# Patient Record
Sex: Female | Born: 1960 | Race: White | Hispanic: No | Marital: Married | Smoking: Never smoker
Health system: Southern US, Community
[De-identification: ages and names within clinical notes are randomized; demographics above are authoritative.]

## PROBLEM LIST (undated history)

## (undated) DIAGNOSIS — B0089 Other herpesviral infection: Secondary | ICD-10-CM

## (undated) DIAGNOSIS — R209 Unspecified disturbances of skin sensation: Secondary | ICD-10-CM

## (undated) DIAGNOSIS — F329 Major depressive disorder, single episode, unspecified: Secondary | ICD-10-CM

## (undated) DIAGNOSIS — R51 Headache: Secondary | ICD-10-CM

## (undated) DIAGNOSIS — B009 Herpesviral infection, unspecified: Secondary | ICD-10-CM

## (undated) DIAGNOSIS — M359 Systemic involvement of connective tissue, unspecified: Secondary | ICD-10-CM

## (undated) DIAGNOSIS — K589 Irritable bowel syndrome without diarrhea: Secondary | ICD-10-CM

## (undated) DIAGNOSIS — C439 Malignant melanoma of skin, unspecified: Secondary | ICD-10-CM

## (undated) DIAGNOSIS — M8580 Other specified disorders of bone density and structure, unspecified site: Secondary | ICD-10-CM

## (undated) DIAGNOSIS — F909 Attention-deficit hyperactivity disorder, unspecified type: Secondary | ICD-10-CM

## (undated) HISTORY — DX: Irritable bowel syndrome, unspecified: K58.9

## (undated) HISTORY — DX: Malignant melanoma of skin, unspecified: C43.9

## (undated) HISTORY — DX: Other herpesviral infection: B00.89

## (undated) HISTORY — PX: CHOLECYSTECTOMY: SHX55

## (undated) HISTORY — DX: Other specified disorders of bone density and structure, unspecified site: M85.80

## (undated) HISTORY — DX: Systemic involvement of connective tissue, unspecified: M35.9

## (undated) HISTORY — DX: Unspecified disturbances of skin sensation: R20.9

## (undated) HISTORY — DX: Attention-deficit hyperactivity disorder, unspecified type: F90.9

## (undated) HISTORY — DX: Herpesviral infection, unspecified: B00.9

## (undated) HISTORY — DX: Major depressive disorder, single episode, unspecified: F32.9

## (undated) HISTORY — DX: Headache: R51

---

## 2000-06-17 ENCOUNTER — Other Ambulatory Visit: Admission: RE | Admit: 2000-06-17 | Discharge: 2000-06-17 | Payer: Self-pay | Admitting: Family Medicine

## 2003-07-06 ENCOUNTER — Other Ambulatory Visit: Admission: RE | Admit: 2003-07-06 | Discharge: 2003-07-06 | Payer: Self-pay | Admitting: Family Medicine

## 2004-10-10 ENCOUNTER — Other Ambulatory Visit: Admission: RE | Admit: 2004-10-10 | Discharge: 2004-10-10 | Payer: Self-pay | Admitting: Family Medicine

## 2005-10-16 ENCOUNTER — Other Ambulatory Visit: Admission: RE | Admit: 2005-10-16 | Discharge: 2005-10-16 | Payer: Self-pay | Admitting: Family Medicine

## 2006-11-26 ENCOUNTER — Other Ambulatory Visit: Admission: RE | Admit: 2006-11-26 | Discharge: 2006-11-26 | Payer: Self-pay | Admitting: Family Medicine

## 2008-04-21 ENCOUNTER — Other Ambulatory Visit: Admission: RE | Admit: 2008-04-21 | Discharge: 2008-04-21 | Payer: Self-pay | Admitting: Family Medicine

## 2008-06-19 ENCOUNTER — Encounter: Payer: Self-pay | Admitting: Gastroenterology

## 2008-12-28 ENCOUNTER — Encounter: Payer: Self-pay | Admitting: Gastroenterology

## 2009-04-23 ENCOUNTER — Encounter: Payer: Self-pay | Admitting: Gastroenterology

## 2009-04-23 ENCOUNTER — Other Ambulatory Visit: Admission: RE | Admit: 2009-04-23 | Discharge: 2009-04-23 | Payer: Self-pay | Admitting: Family Medicine

## 2009-05-24 ENCOUNTER — Encounter: Admission: RE | Admit: 2009-05-24 | Discharge: 2009-05-24 | Payer: Self-pay | Admitting: Family Medicine

## 2009-06-19 ENCOUNTER — Encounter (INDEPENDENT_AMBULATORY_CARE_PROVIDER_SITE_OTHER): Payer: Self-pay | Admitting: *Deleted

## 2009-06-19 ENCOUNTER — Ambulatory Visit: Payer: Self-pay | Admitting: Gastroenterology

## 2009-06-19 DIAGNOSIS — K219 Gastro-esophageal reflux disease without esophagitis: Secondary | ICD-10-CM | POA: Insufficient documentation

## 2009-06-19 DIAGNOSIS — M1990 Unspecified osteoarthritis, unspecified site: Secondary | ICD-10-CM | POA: Insufficient documentation

## 2009-06-19 DIAGNOSIS — R197 Diarrhea, unspecified: Secondary | ICD-10-CM | POA: Insufficient documentation

## 2009-06-19 LAB — CONVERTED CEMR LAB: Tissue Transglutaminase Ab, IgA: 2.6 units (ref <20)

## 2009-06-21 ENCOUNTER — Encounter: Payer: Self-pay | Admitting: Gastroenterology

## 2009-06-21 DIAGNOSIS — E538 Deficiency of other specified B group vitamins: Secondary | ICD-10-CM | POA: Insufficient documentation

## 2009-06-21 LAB — CONVERTED CEMR LAB
Amylase: 56 units/L (ref 27–131)
Folate: 8.9 ng/mL
IgA: 252 mg/dL (ref 68–378)
Magnesium: 1.9 mg/dL (ref 1.5–2.5)
Sed Rate: 15 mm/hr (ref 0–22)
Transferrin: 255.9 mg/dL (ref 212.0–360.0)
Vitamin B-12: 252 pg/mL (ref 211–911)

## 2009-06-26 ENCOUNTER — Ambulatory Visit (HOSPITAL_COMMUNITY): Admission: RE | Admit: 2009-06-26 | Discharge: 2009-06-26 | Payer: Self-pay | Admitting: Gastroenterology

## 2009-06-27 ENCOUNTER — Telehealth: Payer: Self-pay | Admitting: Gastroenterology

## 2009-06-27 ENCOUNTER — Ambulatory Visit: Payer: Self-pay | Admitting: Gastroenterology

## 2009-06-28 DIAGNOSIS — K8020 Calculus of gallbladder without cholecystitis without obstruction: Secondary | ICD-10-CM | POA: Insufficient documentation

## 2009-07-02 ENCOUNTER — Encounter: Payer: Self-pay | Admitting: Gastroenterology

## 2009-08-07 ENCOUNTER — Ambulatory Visit (HOSPITAL_COMMUNITY): Admission: RE | Admit: 2009-08-07 | Discharge: 2009-08-07 | Payer: Self-pay | Admitting: General Surgery

## 2010-03-14 NOTE — Procedures (Signed)
Summary: Colonoscopy  Patient: Shelly Morrison Note: All result statuses are Final unless otherwise noted.  Tests: (1) Colonoscopy (COL)   COL Colonoscopy           DONE     [facility]     [address]     [place], [place]  [address]           COLONOSCOPY PROCEDURE REPORT           PATIENT:  [name], [name]  MR#:  [id number]     BIRTHDATE:  [birth year], 48 yrs. old  GENDER:  female     ENDOSCOPIST:  Vania Rea. Jarold Motto, MD, [facility]     REF. BY:     PROCEDURE DATE:  06/27/2009     PROCEDURE:  Colonoscopy with biopsy and snare polypectomy     ASA CLASS:  Class II     INDICATIONS:  colorectal cancer screening, average risk     MEDICATIONS:   Fentanyl 75 mcg IV, Versed 7 mg IV           DESCRIPTION OF PROCEDURE:   After the risks benefits and     alternatives of the procedure were thoroughly explained, informed     consent was obtained.  Digital rectal exam was performed and     revealed no abnormalities.   The LB [id number] endoscope     was introduced through the anus and advanced to the cecum, which     was identified by both the appendix and ileocecal valve, without     limitations.  The quality of the prep was excellent, using     MoviPrep.  The instrument was then slowly withdrawn as the colon     was fully examined.     <<PROCEDUREIMAGES>>           FINDINGS:  Mild diverticulosis was found in the sigmoid to     descending colon segments. RANDOM LEFT COLON BIOPSIES DONE.  A     sessile polyp was found. 1 CM FLAT POLYP HOT SNARE EXCISED FROM     HEPATIC FLEXURE.   Retroflexed views in the rectum revealed no     abnormalities.    The scope was then withdrawn from the patient     and the procedure completed.           COMPLICATIONS:  None     ENDOSCOPIC IMPRESSION:     1) Mild diverticulosis in the sigmoid to descending colon     segments     2) Sessile polyp     3) Normal colonoscopy otherwise     RECOMMENDATIONS:     1) high fiber diet     2) Repeat Colonoscopy  in 5 years.     3) Await biopsy results     SURGICAL REFERRAL PER SYMPOMATIC GALLSTONES.     REPEAT EXAM:  No           ______________________________     Vania Rea. Jarold Motto, MD, [facility]           CC:           n.     eSIGNED:   Vania Rea. Patterson at 06/27/2009 11:40 AM           Stephan Minister, [id number]  Note: An exclamation mark (!) indicates a result that was not dispersed into the flowsheet. Document Creation Date: 06/28/2009 5:07 PM _______________________________________________________________________  (1) Order result status: Final Collection or observation date-time: 06/27/2009 11:35 Requested  date-time:  Receipt date-time:  Reported date-time:  Referring Physician:   Ordering Physician: Sheryn Bison [phone]) Specimen Source:  Source: Launa Grill Order Number: [id number] Lab site:   Appended Document: Colonoscopy     Procedures Next Due Date:    Colonoscopy: 06/2019

## 2010-03-14 NOTE — Letter (Signed)
Summary: Deboraha Sprang at [facility] at [facility]   Imported By: Lester Aiken 07/13/2009 09:13:57  _____________________________________________________________________  External Attachment:    Type:   Image     Comment:   External Document

## 2010-03-14 NOTE — Letter (Signed)
Summary: [facility] Instructions  [facility]  [address] [place], [place] [address]   Phone: [phone]  Fax: [phone]       Jaida Alonzo    [birth year]    MRN: [id number]        Procedure Day Dorna Bloom: Wednesday, 06/27/09     Arrival Time: 10:00      Procedure Time: 11:00     Location of Procedure:                    _X _  [facility] (4th Floor)                         PREPARATION FOR COLONOSCOPY WITH MOVIPREP   Starting 5 days prior to your procedure 06/22/09 do not eat nuts, seeds, popcorn, corn, beans, peas,  salads, or any raw vegetables.  Do not take any fiber supplements (e.g. Metamucil, Citrucel, and Benefiber).  THE DAY BEFORE YOUR PROCEDURE         DATE: 06/26/09   DAY: Tuesday  1.  Drink clear liquids the entire day-NO SOLID FOOD  2.  Do not drink anything colored red or purple.  Avoid juices with pulp.  No orange juice.  3.  Drink at least 64 oz. (8 glasses) of fluid/clear liquids during the day to prevent dehydration and help the prep work efficiently.  CLEAR LIQUIDS INCLUDE: Water Jello Ice Popsicles Tea (sugar ok, no milk/cream) Powdered fruit flavored drinks Coffee (sugar ok, no milk/cream) Gatorade Juice: apple, white grape, white cranberry  Lemonade Clear bullion, consomm, broth Carbonated beverages (any kind) Strained chicken noodle soup Hard Candy                             4.  In the morning, mix first dose of MoviPrep solution:    Empty 1 Pouch A and 1 Pouch B into the disposable container    Add lukewarm drinking water to the top line of the container. Mix to dissolve    Refrigerate (mixed solution should be used within 24 hrs)  5.  Begin drinking the prep at 5:00 p.m. The MoviPrep container is divided by 4 marks.   Every 15 minutes drink the solution down to the next mark (approximately 8 oz) until the full liter is complete.   6.  Follow completed prep with 16 oz of clear liquid of your choice (Nothing  red or purple).  Continue to drink clear liquids until bedtime.  7.  Before going to bed, mix second dose of MoviPrep solution:    Empty 1 Pouch A and 1 Pouch B into the disposable container    Add lukewarm drinking water to the top line of the container. Mix to dissolve    Refrigerate  THE DAY OF YOUR PROCEDURE      DATE: 06/27/09  DAY: Wednesday  Beginning at 6:00 a.m. (5 hours before procedure):         1. Every 15 minutes, drink the solution down to the next mark (approx 8 oz) until the full liter is complete.  2. Follow completed prep with 16 oz. of clear liquid of your choice.    3. You may drink clear liquids until 9:00 (2 HOURS BEFORE PROCEDURE).   MEDICATION INSTRUCTIONS  Unless otherwise instructed, you should take regular prescription medications with a small sip of water   as early as possible the morning  of your procedure.                  OTHER INSTRUCTIONS  You will need a responsible adult at least 48 years of age to accompany you and drive you home.   This person must remain in the waiting room during your procedure.  Wear loose fitting clothing that is easily removed.  Leave jewelry and other valuables at home.  However, you may wish to bring a book to read or  an iPod/MP3 player to listen to music as you wait for your procedure to start.  Remove all body piercing jewelry and leave at home.  Total time from sign-in until discharge is approximately 2-3 hours.  You should go home directly after your procedure and rest.  You can resume normal activities the  day after your procedure.  The day of your procedure you should not:   Drive   Make legal decisions   Operate machinery   Drink alcohol   Return to work  You will receive specific instructions about eating, activities and medications before you leave.    The above instructions have been reviewed and explained to me by   _______________________    I fully understand and can  verbalize these instructions _____________________________ Date _________

## 2010-03-14 NOTE — Letter (Signed)
Summary: Patient Notice- Polyp Results  [facility]  [address] [place], [place] [address]   Phone: [phone]  Fax: [phone]        Jul 02, 2009 MRN: [id number]    [facility] [address] [place], [place]  [address]    Dear Ms. Tacker,  I am pleased to inform you that the colon polyp(s) removed during your recent colonoscopy was (were) found to be benign (no cancer detected) upon pathologic examination.  I recommend you have a repeat colonoscopy examination in 10_ years to look for recurrent polyps, as having colon polyps increases your risk for having recurrent polyps or even colon cancer in the future.  Should you develop new or worsening symptoms of abdominal pain, bowel habit changes or bleeding from the rectum or bowels, please schedule an evaluation with either your primary care physician or with me.  Additional information/recommendations:  __ No further action with gastroenterology is needed at this time. Please      follow-up with your primary care physician for your other healthcare      needs.  __ Please call [phone] to schedule a return visit to review your      situation.  __ Please keep your follow-up visit as already scheduled.  _XX_ Continue treatment plan as outlined the day of your exam.  Please call us if you are having persistent problems or have questions about your condition that have not been fully answered at this time.  Sincerely,  Mardella Layman MD [facility]  This letter has been electronically signed by your physician.  Appended Document: Patient Notice- Polyp Results letter mailed

## 2010-03-14 NOTE — Assessment & Plan Note (Signed)
Summary: change in bms...em   History of Present Illness Visit Type: Initial Visit Primary GI MD: Sheryn Bison MD FACP FAGA Primary Provider: Corky Mull, PA Chief Complaint: A recent change in bowels with mucus in stool and lighter stools. Pt also has a increase in indigestion.  History of Present Illness:   Shelly Morrison 50 year old Caucasian female self referred for" change in bowel habits". She has gone from pellet-shaped stools to foul-smelling oily and greasy stools with some  oil droplets noted. She has had no anorexia, weight loss, abdominal pain, or melena. She denies any changes in her diet, and does not have lactose intolerance, use sorbitol or fructose dose, or had any new medications.She has not had prior GI evaluation or x-rays. She also says several episodes of severe indigestion and substernal chest pain alleviated by taking Nexium. She denies daily reflux symptoms or dysphagia. She denies any specific hepatobiliary complaints. She does take p.r.n. dalteparin and Aleve for degenerative arthritis. There are no associated skin rashes, mouth sores, or other systemic complaints. She has no history of hepatitis or pancreatitis, but does have several relatives with pancreatic cancer. She does not abuse alcohol or cigarettes.    GI Review of Systems    Reports belching and  bloating.      Denies abdominal pain, acid reflux, chest pain, dysphagia with liquids, dysphagia with solids, heartburn, loss of appetite, nausea, vomiting, vomiting blood, weight loss, and  weight gain.      Reports change in bowel habits.     Denies anal fissure, black tarry stools, constipation, diarrhea, diverticulosis, fecal incontinence, heme positive stool, hemorrhoids, irritable bowel syndrome, jaundice, light color stool, liver problems, rectal bleeding, and  rectal pain. Preventive Screening-Counseling & Management  Alcohol-Tobacco     Smoking Status: never      Drug Use:  no.      Current Medications  (verified): 1)  Antihistamine Decongestant 2.5-60 Mg Tabs (Triprolidine-Pseudoephedrine) .... As Needed 2)  Valtrex 500 Mg Tabs (Valacyclovir Hcl) .... One Tablet By Mouth Once Daily 3)  Voltaren-Xr 100 Mg Xr24h-Tab (Diclofenac Sodium) .... As Needed 4)  Vitamin D 2000 Unit Tabs (Cholecalciferol) .... One Capsule By Mouth Once Daily 5)  Vitamin C 500 Mg Tabs (Ascorbic Acid) .... One Tablet By Mouth Once Daily 6)  Aleve 220 Mg Tabs (Naproxen Sodium) .... As Needed 7)  Excedrin Migraine 250-250-65 Mg Tabs (Aspirin-Acetaminophen-Caffeine) .... As Needed  Allergies (verified): 1)  ! Sulfa 2)  ! Erythromycin  Past History:  Past medical, surgical, family and social histories (including risk factors) reviewed for relevance to current acute and chronic problems.  Past Medical History: Depression  Past Surgical History: C-section  Family History: Reviewed history and no changes required. Family History of Pancreatic Cancer:Grandmother Family History of Diabetes: Uncle Mother: Melanoma Father: Bladder cancer Family History of Heart Disease: Grandfather  Social History: Reviewed history and no changes required. Married Patient has never smoked.  Alcohol Use - yes 2 glasses once daily  Daily Caffeine Use Illicit Drug Use - no Smoking Status:  never Drug Use:  no  Review of Systems       The patient complains of arthritis/joint pain.  The patient denies allergy/sinus, anemia, anxiety-new, back pain, blood in urine, breast changes/lumps, change in vision, confusion, cough, coughing up blood, depression-new, fainting, fatigue, fever, headaches-new, hearing problems, heart murmur, heart rhythm changes, itching, menstrual pain, muscle pains/cramps, night sweats, nosebleeds, pregnancy symptoms, shortness of breath, skin rash, sleeping problems, sore throat, swelling of feet/legs, swollen lymph  glands, thirst - excessive , urination - excessive , urination changes/pain, urine leakage, vision  changes, and voice change.    Vital Signs:  Patient profile:   50 year old female Height:      63 inches Weight:      171.38 pounds BMI:     30.47 Pulse rate:   80 / minute Pulse rhythm:   regular BP sitting:   106 / 68  (right arm) Cuff size:   regular  Vitals Entered By: Christie Nottingham CMA Duncan Dull) (Jun 19, 2009 8:44 AM)  Physical Exam  General:  Well developed, well nourished, no acute distress.healthy appearing.   Head:  Normocephalic and atraumatic. Eyes:  PERRLA, no icterus.exam deferred to patient's ophthalmologist.   Neck:  Supple; no masses or thyromegaly. Lungs:  Clear throughout to auscultation. Heart:  Regular rate and rhythm; no murmurs, rubs,  or bruits. Abdomen:  Soft, nontender and nondistended. No masses, hepatosplenomegaly or hernias noted. Normal bowel sounds. Rectal:  Normal exam.hemocult negative.   Msk:  Symmetrical with no gross deformities. Normal posture. Pulses:  Normal pulses noted. Extremities:  No clubbing, cyanosis, edema or deformities noted. Neurologic:  Alert and  oriented x4;  grossly normal neurologically. Cervical Nodes:  No significant cervical adenopathy. Psych:  Alert and cooperative. Normal mood and affect.   Impression & Recommendations:  Problem # 1:  DIARRHEA (ICD-787.91) Assessment Unchanged Consider malabsorption syndrome and associated steatorrhea. I've ordered stool for fat exam, metabolic malabsorption parameters, upper abdominal ultrasound exam, amylase, lipase, and also colonoscopy exam for screening purposes. Have asked her to avoid any products with sorbitol or fructose. She may need a more extensive malabsorption workup depending on her initial evaluation. I have asked her to sign for recent labs from primary care at [facility].Have instituted a trial of probiotic therapy with daily Align. Orders: TLB-B12, Serum-Total ONLY (21308-M57) TLB-Ferritin (82728-FER) TLB-Folic Acid (Folate) (82746-FOL) TLB-IBC Pnl  (Iron/FE;Transferrin) (83550-IBC) TLB-Amylase (82150-AMYL) TLB-Lipase (83690-LIPASE) TLB-Magnesium (Mg) (83735-MG) TLB-Sedimentation Rate (ESR) (85652-ESR) TLB-TSH (Thyroid Stimulating Hormone) (84443-TSH) T-Beta Carotene (84696-29528) TLB-IgA (Immunoglobulin A) (82784-IGA) T-Sprue Panel (Celiac Disease Aby Eval) (83516x3/[address])  Problem # 2:  ESOPHAGEAL REFLUX (ICD-530.81) Assessment: Unchanged upper abdominal ultrasound exam ordered. She is to use p.r.n. Nexium and p.r.n. sublingual Levsin. Orders: TLB-B12, Serum-Total ONLY (41324-M01) TLB-Ferritin (82728-FER) TLB-Folic Acid (Folate) (82746-FOL) TLB-IBC Pnl (Iron/FE;Transferrin) (83550-IBC) TLB-Amylase (82150-AMYL) TLB-Lipase (83690-LIPASE) TLB-Magnesium (Mg) (83735-MG) TLB-Sedimentation Rate (ESR) (85652-ESR) TLB-TSH (Thyroid Stimulating Hormone) (84443-TSH) T-Beta Carotene (02725-36644) TLB-IgA (Immunoglobulin A) (82784-IGA) T-Sprue Panel (Celiac Disease Aby Eval) (83516x3/[address])  Problem # 3:  NEOPLASM, MALIGNANT, PANCREAS, FAMILY HX (ICD-V16.0) Assessment: Unchanged amylase, lipase, and ultrasound exam ordered. If there is evidence of malabsorption, for the pancreatic evaluation may be indicated. This would include stool exam for fecal elastase-1.  Problem # 4:  DEGENERATIVE JOINT DISEASE (ICD-715.90) Assessment: Unchanged Some of her GI complaints may be related to use of several different NSAIDs which I have asked her to stop. There is no evidence of inflammatory arthritis on physical exam.  Patient Instructions: 1)  Please go to teh basement for blood work. 2)  Multimedia programmer daily. 3)  Begin Levsin as needed for abd spasms 4)  You are scheduled for a colonoscopy. 5)  You are scheduled for an ultrasound. 6)  The medication list was reviewed and reconciled.  All changed / newly prescribed medications were explained.  A complete medication list was provided to the patient / caregiver. 7)  Please continue  current medications.  8)  Copy sent to :  Corky Mull, physician assistant at [facility]. Labs have been requested for review.  Appended Document: change in bms...em    Clinical Lists Changes  Medications: Added new medication of MOVIPREP 100 GM  SOLR (PEG-KCL-NACL-NASULF-NA ASC-C) As per prep instructions. - Signed Added new medication of LEVSIN/SL 0.125 MG  SUBL (HYOSCYAMINE SULFATE) 1 SL q 4-6 hrs as needed colon spasms - Signed Added new medication of ALIGN   CAPS (MISC INTESTINAL FLORA REGULAT) Take one capsule by mouth daily Rx of MOVIPREP 100 GM  SOLR (PEG-KCL-NACL-NASULF-NA ASC-C) As per prep instructions.;  #1 x 0;  Signed;  Entered by: Ashok Cordia RN;  Authorized by: Mardella Layman MD [facility];  Method used: Electronically to [facility] [address] [place], [place]  [id number], Ph: [phone], Fax: [phone] Rx of LEVSIN/SL 0.125 MG  SUBL (HYOSCYAMINE SULFATE) 1 SL q 4-6 hrs as needed colon spasms;  #40 x 3;  Signed;  Entered by: Ashok Cordia RN;  Authorized by: Mardella Layman MD [facility];  Method used: Electronically to [facility] [address], [place], [place]  [id number], Ph: [phone], Fax: [phone] Orders: Added new Test order of Colonoscopy (Colon) - Signed Added new Test order of Ultrasound Abdomen (UAS) - Signed    Prescriptions: LEVSIN/SL 0.125 MG  SUBL (HYOSCYAMINE SULFATE) 1 SL q 4-6 hrs as needed colon spasms  #40 x 3   Entered by:   Ashok Cordia RN   Authorized by:   Mardella Layman MD [facility]   Signed by:   Ashok Cordia RN on 06/19/2009   Method used:   Electronically to        [facility]* (retail)       [address]       [place], [place]  [address]       Ph: [phone]       Fax: [phone]   RxID:   [id number] MOVIPREP 100 GM  SOLR (PEG-KCL-NACL-NASULF-NA ASC-C) As per prep instructions.  #1 x 0   Entered by:   Ashok Cordia RN   Authorized by:   Mardella Layman MD [facility]    Signed by:   Ashok Cordia RN on 06/19/2009   Method used:   Electronically to        [facility]* (retail)       [address]       [place], [place]  [address]       Ph: [phone]       Fax: [phone]   RxID:   [id number]

## 2010-03-14 NOTE — Letter (Signed)
Summary: Deboraha Sprang at [facility] at [facility]   Imported By: Lester Addison 07/13/2009 09:23:38  _____________________________________________________________________  External Attachment:    Type:   Image     Comment:   External Document

## 2010-03-14 NOTE — Progress Notes (Signed)
Summary: Surgical referral  Phone Note Outgoing Call   Summary of Call: Per Dr. Jarold Motto.  Pt needs surgical referral re Gallstones. Initial call taken by: Ashok Cordia RN,  Jun 27, 2009 12:32 PM  Follow-up for Phone Call        records faxed to [facility] at [facility] for new pt referral. Follow-up by: Ashok Cordia RN,  Jun 27, 2009 4:56 PM  Additional Follow-up for Phone Call Additional follow up Details #1::        LM for [facility] to call.  Lupita Leash Surface RN  Jun 28, 2009 12:58 PM   Appt sch for pt to see Dr. Zachery Dakins on June 1 at 1:45.  Elane Fritz will contact pt. Additional Follow-up by: Ashok Cordia RN,  Jun 28, 2009 1:22 PM  New Problems: CHOLELITHIASIS (ICD-574.20)   New Problems: CHOLELITHIASIS (ICD-574.20)

## 2010-04-28 LAB — DIFFERENTIAL
Eosinophils Absolute: 0.1 10*3/uL (ref 0.0–0.7)
Lymphs Abs: 1.4 10*3/uL (ref 0.7–4.0)
Monocytes Absolute: 0.4 10*3/uL (ref 0.1–1.0)
Monocytes Relative: 9 % (ref 3–12)
Neutro Abs: 2.5 10*3/uL (ref 1.7–7.7)
Neutrophils Relative %: 57 % (ref 43–77)

## 2010-04-28 LAB — COMPREHENSIVE METABOLIC PANEL
CO2: 29 mEq/L (ref 19–32)
Calcium: 9.4 mg/dL (ref 8.4–10.5)
Chloride: 104 mEq/L (ref 96–112)
GFR calc Af Amer: >60 mL/min (ref >60)
GFR calc non Af Amer: >60 mL/min (ref >60)
Sodium: 139 mEq/L (ref 135–145)
Total Bilirubin: 0.4 mg/dL (ref 0.3–1.2)

## 2010-04-28 LAB — CBC
HCT: 41.3 % (ref 36.0–46.0)
MCH: 32 pg (ref 26.0–34.0)
MCV: 97 fL (ref 78.0–100.0)
Platelets: 222 10*3/uL (ref 150–400)
RBC: 4.26 MIL/uL (ref 3.87–5.11)
RDW: 12.5 % (ref 11.5–15.5)
WBC: 4.3 10*3/uL (ref 4.0–10.5)

## 2010-05-08 ENCOUNTER — Other Ambulatory Visit: Payer: Self-pay | Admitting: Family Medicine

## 2010-05-08 DIAGNOSIS — Z1231 Encounter for screening mammogram for malignant neoplasm of breast: Secondary | ICD-10-CM

## 2010-05-13 ENCOUNTER — Other Ambulatory Visit (HOSPITAL_COMMUNITY)
Admission: RE | Admit: 2010-05-13 | Discharge: 2010-05-13 | Disposition: A | Discharge status: Home / Self Care | Payer: BC Managed Care – PPO | Source: Ambulatory Visit | Attending: Family Medicine | Admitting: Family Medicine

## 2010-05-13 ENCOUNTER — Other Ambulatory Visit: Payer: Self-pay | Admitting: Physician Assistant

## 2010-05-13 DIAGNOSIS — Z124 Encounter for screening for malignant neoplasm of cervix: Secondary | ICD-10-CM | POA: Insufficient documentation

## 2010-05-27 ENCOUNTER — Ambulatory Visit
Admission: RE | Admit: 2010-05-27 | Discharge: 2010-05-27 | Disposition: A | Discharge status: Home / Self Care | Payer: BC Managed Care – PPO | Source: Ambulatory Visit | Attending: Family Medicine | Admitting: Family Medicine

## 2010-05-27 DIAGNOSIS — Z1231 Encounter for screening mammogram for malignant neoplasm of breast: Secondary | ICD-10-CM

## 2010-09-28 IMAGING — US US ABDOMEN COMPLETE
1 series · 14 of 25 positions shown · non-contrast
Comparison: None.

CLINICAL DATA: Abdominal pain and reflux

ABDOMINAL ULTRASOUND COMPLETE

[Series 1: us abdomen complete · 0.28mm/px · 14 of 100 slices shown]
[im 1/100]
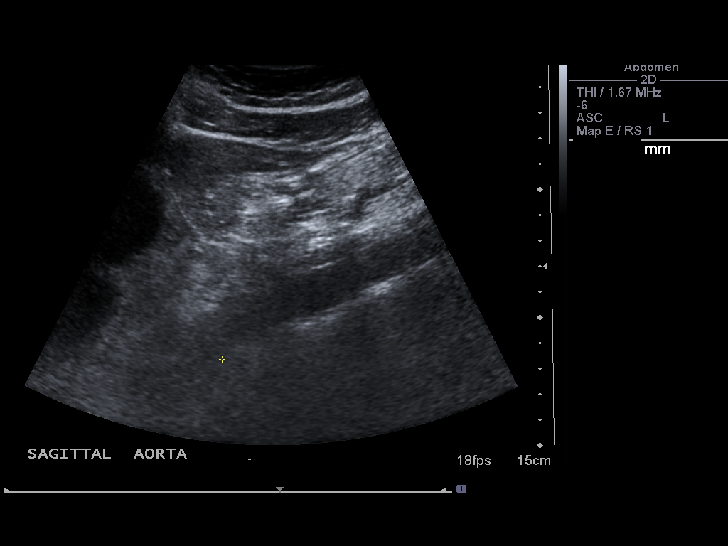
[im 9/100]
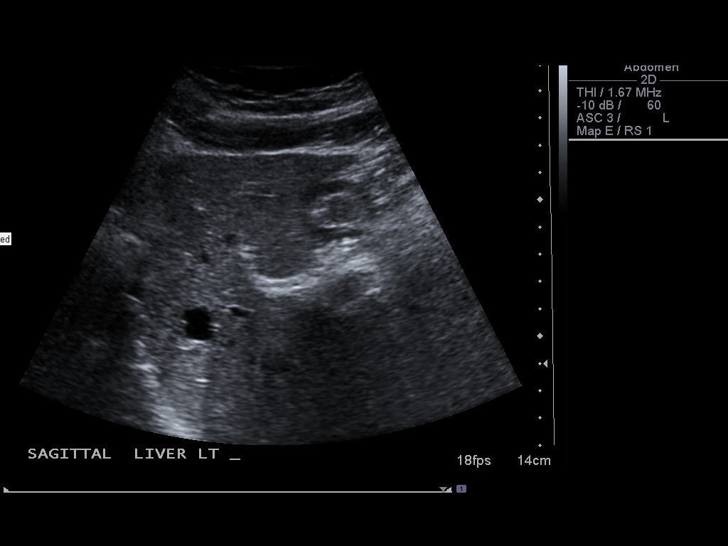
[im 17/100]
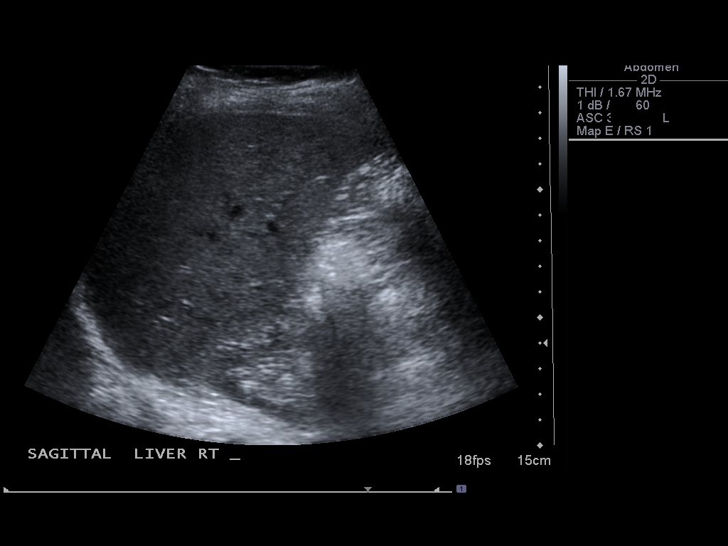
[im 25/100]
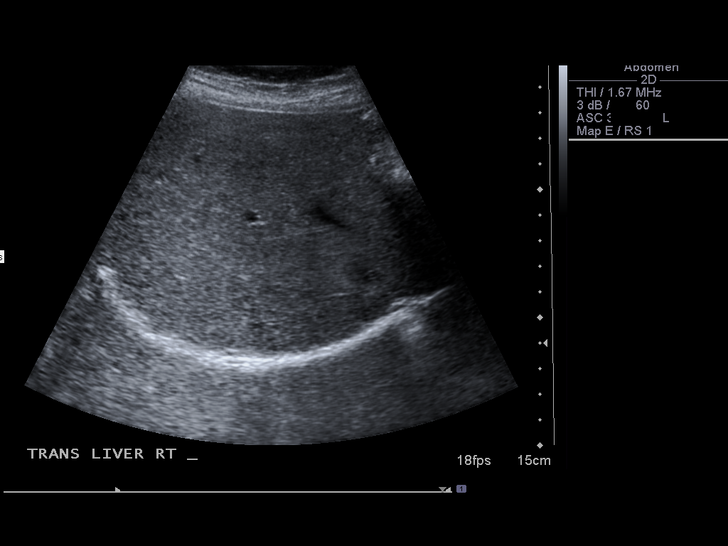
[im 34/100]
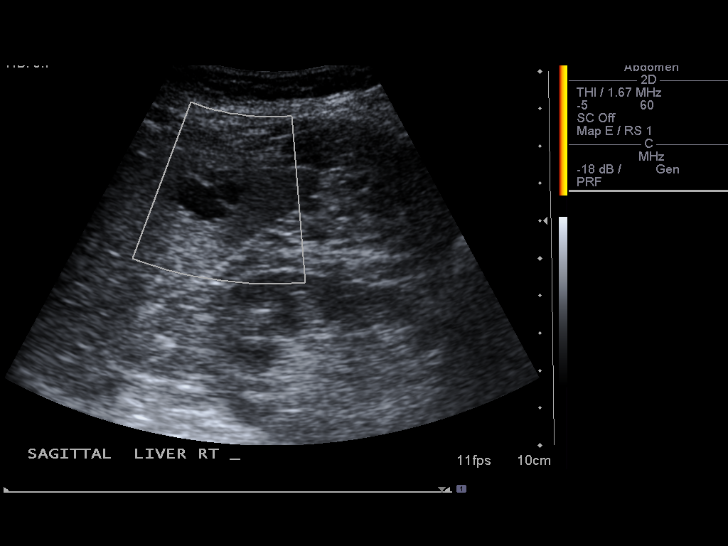
[im 38/100]
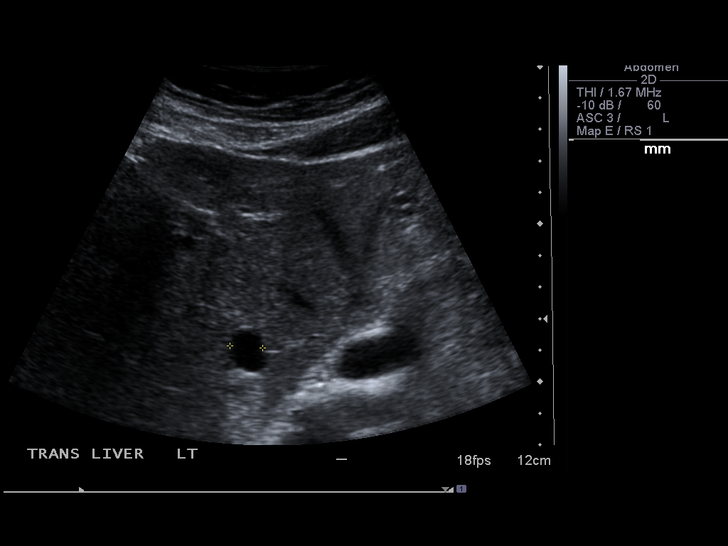
[im 46/100]
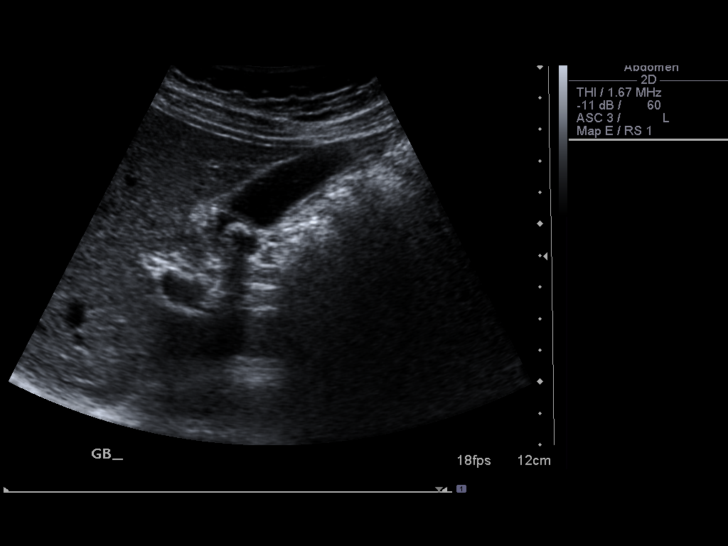
[im 54/100]
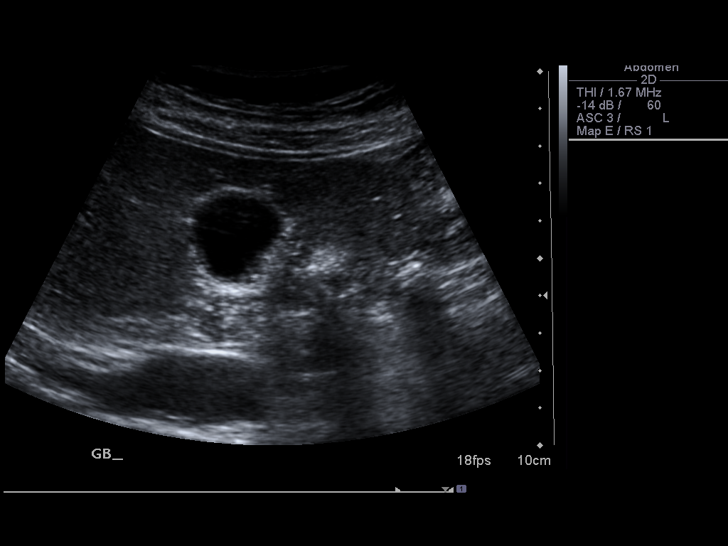
[im 62/100]
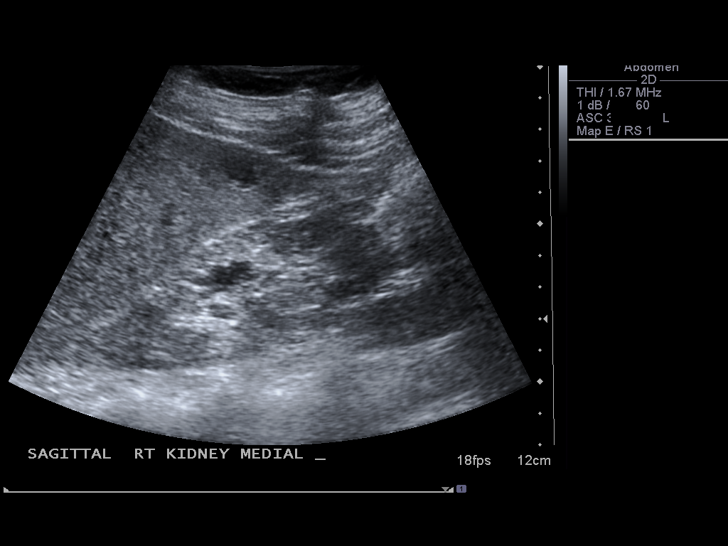
[im 67/100]
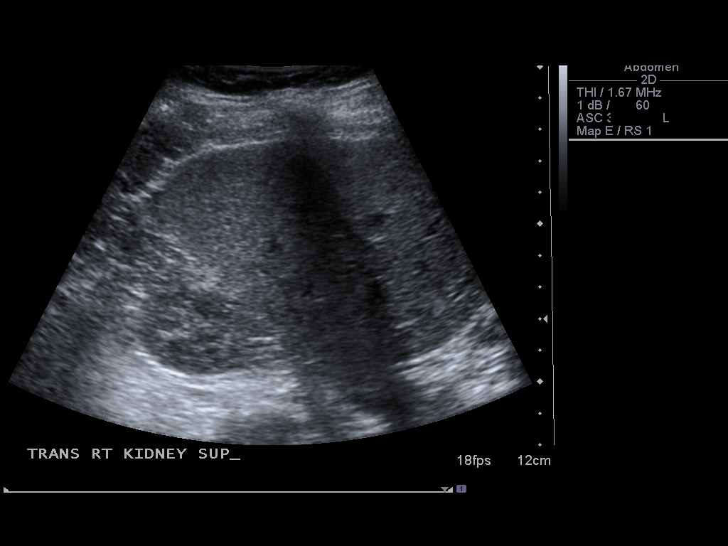
[im 75/100]
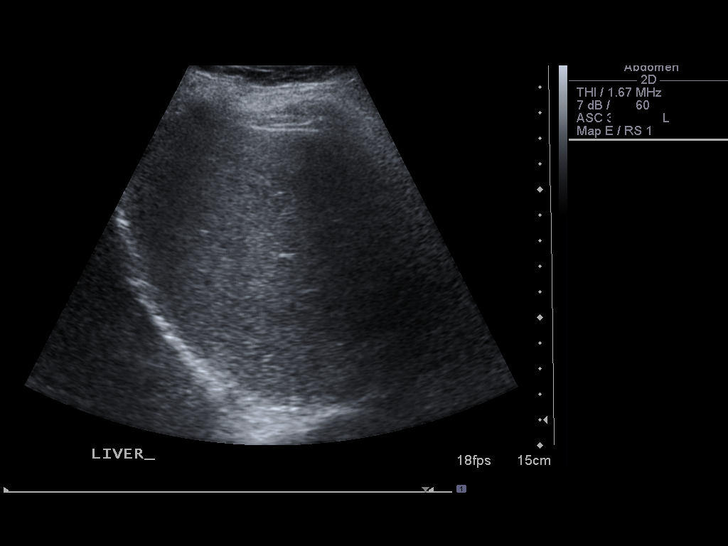
[im 83/100]
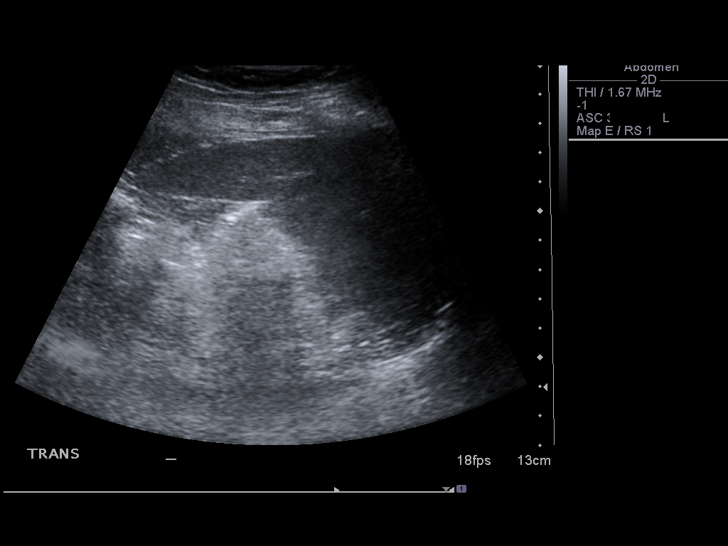
[im 91/100]
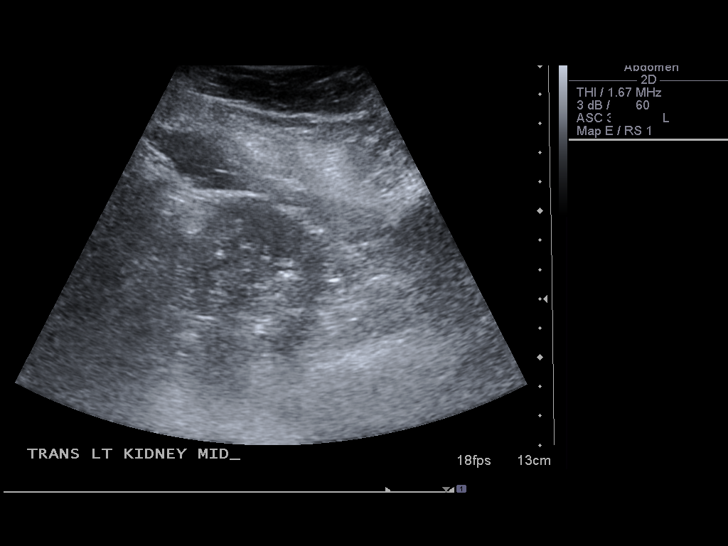
[im 100/100]
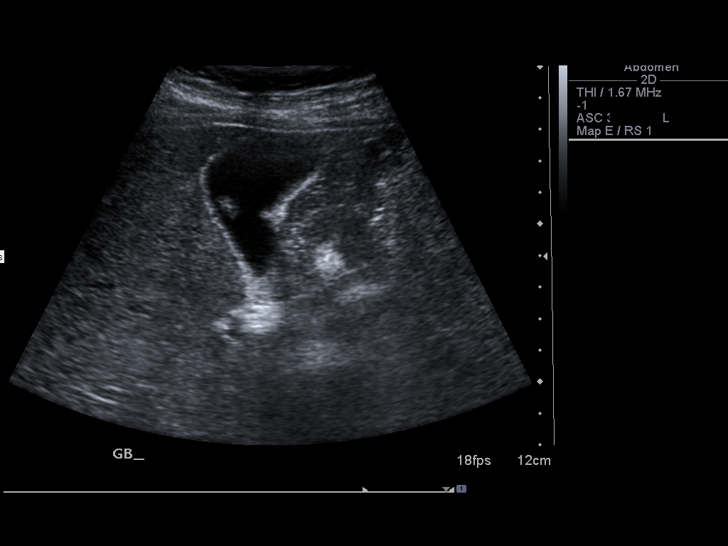

[14 of 25 positions shown; findings below may reference images not displayed]

FINDINGS: Gallbladder:  There are small mobile gallstones within the
gallbladder lumen.  No gallbladder wall thickening or
pericholecystic fluid.

Common Bile Duct:  Within normal limits in caliber, measuring 4 mm
in width.

Liver:  There is a 1.5 cm simple cyst in the left lobe and a 2.1 cm
septated cyst in the right lobe.   Within normal limits in
parenchymal echogenicity.

IVC:  Appears normal.

Pancreas:  No abnormality identified, although entire pancreas
cannot be visualized by ultrasound.

Spleen:  Within normal limits in size and echotexture.

Right kidney:  Normal in size and parenchymal echogenicity.  No
evidence of mass or hydronephrosis.

Left kidney:  Normal in size and parenchymal echogenicity.  No
evidence of mass or hydronephrosis.

Abdominal Aorta:  No aneurysm identified.
IMPRESSION: Cholelithiasis.

There are two hepatic cyst.  One has internal septations but is
likely benign.  Follow-up ultrasound in 3- 4 months is recommended
to document stability.  Alternatively, further characterization
with CT scan could be considered.

## 2011-04-30 ENCOUNTER — Other Ambulatory Visit: Payer: Self-pay | Admitting: Family Medicine

## 2011-04-30 DIAGNOSIS — Z1231 Encounter for screening mammogram for malignant neoplasm of breast: Secondary | ICD-10-CM

## 2011-05-16 ENCOUNTER — Other Ambulatory Visit (HOSPITAL_COMMUNITY)
Admission: RE | Admit: 2011-05-16 | Discharge: 2011-05-16 | Disposition: A | Discharge status: Home / Self Care | Payer: BC Managed Care – PPO | Source: Ambulatory Visit | Attending: Family Medicine | Admitting: Family Medicine

## 2011-05-16 ENCOUNTER — Other Ambulatory Visit: Payer: Self-pay | Admitting: Physician Assistant

## 2011-05-16 DIAGNOSIS — Z01419 Encounter for gynecological examination (general) (routine) without abnormal findings: Secondary | ICD-10-CM | POA: Insufficient documentation

## 2011-05-21 ENCOUNTER — Other Ambulatory Visit: Payer: Self-pay | Admitting: Family Medicine

## 2011-05-21 ENCOUNTER — Ambulatory Visit
Admission: RE | Admit: 2011-05-21 | Discharge: 2011-05-21 | Disposition: A | Discharge status: Home / Self Care | Payer: BC Managed Care – PPO | Source: Ambulatory Visit | Attending: Family Medicine | Admitting: Family Medicine

## 2011-05-21 DIAGNOSIS — M545 Low back pain, unspecified: Secondary | ICD-10-CM

## 2011-05-29 ENCOUNTER — Ambulatory Visit
Admission: RE | Admit: 2011-05-29 | Discharge: 2011-05-29 | Disposition: A | Discharge status: Home / Self Care | Payer: BC Managed Care – PPO | Source: Ambulatory Visit | Attending: Family Medicine | Admitting: Family Medicine

## 2011-05-29 DIAGNOSIS — Z1231 Encounter for screening mammogram for malignant neoplasm of breast: Secondary | ICD-10-CM

## 2012-03-23 ENCOUNTER — Other Ambulatory Visit: Payer: Self-pay | Admitting: Dermatology

## 2012-05-07 ENCOUNTER — Other Ambulatory Visit: Payer: Self-pay

## 2012-05-07 DIAGNOSIS — Z1231 Encounter for screening mammogram for malignant neoplasm of breast: Secondary | ICD-10-CM

## 2012-05-31 ENCOUNTER — Ambulatory Visit
Admission: RE | Admit: 2012-05-31 | Discharge: 2012-05-31 | Disposition: A | Discharge status: Home / Self Care | Payer: BC Managed Care – PPO | Source: Ambulatory Visit

## 2012-05-31 DIAGNOSIS — Z1231 Encounter for screening mammogram for malignant neoplasm of breast: Secondary | ICD-10-CM

## 2012-06-01 ENCOUNTER — Other Ambulatory Visit: Payer: Self-pay | Admitting: Physician Assistant

## 2012-06-01 ENCOUNTER — Other Ambulatory Visit (HOSPITAL_COMMUNITY)
Admission: RE | Admit: 2012-06-01 | Discharge: 2012-06-01 | Disposition: A | Discharge status: Home / Self Care | Payer: BC Managed Care – PPO | Source: Ambulatory Visit | Attending: Family Medicine | Admitting: Family Medicine

## 2012-06-01 DIAGNOSIS — Z124 Encounter for screening for malignant neoplasm of cervix: Secondary | ICD-10-CM | POA: Insufficient documentation

## 2012-06-23 ENCOUNTER — Other Ambulatory Visit: Payer: Self-pay | Admitting: Dermatology

## 2012-07-28 ENCOUNTER — Other Ambulatory Visit: Payer: Self-pay | Admitting: Dermatology

## 2012-10-08 ENCOUNTER — Encounter: Payer: Self-pay | Admitting: Neurology

## 2012-10-12 ENCOUNTER — Ambulatory Visit (INDEPENDENT_AMBULATORY_CARE_PROVIDER_SITE_OTHER): Payer: BC Managed Care – PPO | Admitting: Neurology

## 2012-10-12 ENCOUNTER — Encounter: Payer: Self-pay | Admitting: Neurology

## 2012-10-12 ENCOUNTER — Telehealth: Payer: Self-pay | Admitting: Neurology

## 2012-10-12 VITALS — BP 98/66 | HR 70 | Ht 62.0 in | Wt 159.5 lb

## 2012-10-12 DIAGNOSIS — M1990 Unspecified osteoarthritis, unspecified site: Secondary | ICD-10-CM

## 2012-10-12 DIAGNOSIS — R209 Unspecified disturbances of skin sensation: Secondary | ICD-10-CM

## 2012-10-12 DIAGNOSIS — G5601 Carpal tunnel syndrome, right upper limb: Secondary | ICD-10-CM

## 2012-10-12 DIAGNOSIS — G5600 Carpal tunnel syndrome, unspecified upper limb: Secondary | ICD-10-CM

## 2012-10-12 HISTORY — DX: Unspecified disturbances of skin sensation: R20.9

## 2012-10-12 NOTE — Telephone Encounter (Signed)
I called the patient. The nerve conduction studies show evidence of a right carpal tunnel syndrome. The patient will wear a wrist splint for 6 weeks, if no better, she will call our office.

## 2012-10-12 NOTE — Progress Notes (Signed)
Reason for visit: Numbness  Shelly Morrison is a 52 y.o. female  History of present illness:  Shelly Morrison is a 52 year old left-handed white female with a history of numbness involving the arms and hands over the last one year. The patient indicates that she has been seen previously by Dr. Ethelene Hal, and she underwent a cervical MRI study and was told that she had degenerative disc disease. Surgery was not recommended. The results of this study are not available to me. The patient apparently had an attempted EMG and nerve conduction study, but the patient refused what sounds like the EMG evaluation. The patient does not believe that a nerve conduction study was done at that time. The patient indicates that she has some undulation of symptoms, and the right arm is involved more than the left. The patient has the sensation of swelling in the hands, without actual swelling. The patient has some decreased grip strength as well. The patient denies any pain radiating from the neck and shoulders down the arm. The patient occasionally will have some tingling in the feet. The patient has some slight imbalance issues, but she denies problems controlling the bowels or the bladder. The patient indicates that the numbness may worsen while driving a car. The patient comes to this office for an evaluation. The patient also reports some left hip, neck, low back and knee pain. The patient has been seen by Dr. Nickola Major, and blood work has been taken to look for connective tissue disorders. The patient has been told that all of the blood work has been unremarkable. The patient has been on nonsteroidal anti-inflammatory medications for her arthritic discomfort.  Past Medical History  Diagnosis Date  . Herpes simplex virus type 1 (HSV-1) dermatitis   . Routine culture positive for herpes simplex virus type 2   . Headache(784.0)   . IBS (irritable bowel syndrome)   . Major depressive disorder   . ADHD (attention  deficit hyperactivity disorder)   . Osteopenia   . Connective tissue disease   . Osteopenia     periarticular of hands  . Disturbance of skin sensation 10/12/2012    Past Surgical History  Procedure Laterality Date  . Cesarean section    . Cholecystectomy      History reviewed. No pertinent family history.  Social history:  reports that she has never smoked. She has never used smokeless tobacco. She reports that  drinks alcohol. She reports that she does not use illicit drugs.  Medications:  Current Outpatient Prescriptions on File Prior to Visit  Medication Sig Dispense Refill  . cyclobenzaprine (FLEXERIL) 10 MG tablet Take 10 mg by mouth daily as needed for muscle spasms.      Marland Kitchen eletriptan (RELPAX) 40 MG tablet Take 40 mg by mouth as needed for migraine. One tablet by mouth at onset of headache. May repeat in 2 hours if headache persists or recurs.      Marland Kitchen escitalopram (LEXAPRO) 10 MG tablet Take 10 mg by mouth daily.      . naproxen sodium (ANAPROX) 220 MG tablet Take 220 mg by mouth 2 (two) times daily with a meal.       . Specialty Vitamins Products (MAGNESIUM, AMINO ACID CHELATE,) 133 MG tablet Take 300 tablets by mouth daily. With meal      . valACYclovir (VALTREX) 500 MG tablet Take 500 mg by mouth daily.       No current facility-administered medications on file prior to visit.  Allergies  Allergen Reactions  . Codeine Sulfate Nausea And Vomiting  . Paxil [Paroxetine Hcl]     PAXIL CR  . Wellbutrin [Bupropion]   . Erythromycin     REACTION: N/V  . Sulfonamide Derivatives     ROS:  Out of a complete 14 system review of symptoms, the patient complains only of the following symptoms, and all other reviewed systems are negative.  Easy bruising Feeling hot Joint pain, achy muscles Headache, numbness Depression, insomnia, racing thoughts  Blood pressure 98/66, pulse 70, height 5\' 2"  (1.575 m), weight 159 lb 8 oz (72.349 kg).  Physical Exam  General: The  patient is alert and cooperative at the time of the examination.  Head: Pupils are equal, round, and reactive to light. Discs are flat bilaterally.  Neck: The neck is supple, no carotid bruits are noted.  Respiratory: The respiratory examination is clear.  Cardiovascular: The cardiovascular examination reveals a regular rate and rhythm, no obvious murmurs or rubs are noted.  Neuromuscular: Range of movement of the cervical spine is full.  Skin: Extremities are without significant edema.  Neurologic Exam  Mental status:  Cranial nerves: Facial symmetry is present. There is good sensation of the face to pinprick and soft touch bilaterally. The strength of the facial muscles and the muscles to head turning and shoulder shrug are normal bilaterally. Speech is well enunciated, no aphasia or dysarthria is noted. Extraocular movements are full. Visual fields are full.  Motor: The motor testing reveals 5 over 5 strength of all 4 extremities. Good symmetric motor tone is noted throughout. Tinel sign is positive bilaterally at the wrists.  Sensory: Sensory testing is intact to pinprick, soft touch, vibration sensation, and position sense on all 4 extremities. No evidence of extinction is noted.  Coordination: Cerebellar testing reveals good finger-nose-finger and heel-to-shin bilaterally.  Gait and station: Gait is normal. Tandem gait is normal. Romberg is negative. No drift is seen.  Reflexes: Deep tendon reflexes are symmetric and normal bilaterally. Toes are downgoing bilaterally.   Assessment/Plan:  1. Bilateral arm numbness  The patient is having symptoms that could be consistent with carpal tunnel syndrome. The patient will be set up for nerve conduction studies of both upper extremities. If this is unremarkable, I will try to obtain the disc of a cervical MRI done through Dr. Ethelene Hal. The patient will followup if needed. Clinical examination is normal.  Addendum: Nerve conduction  studies done today shows evidence of a mild right carpal tunnel syndrome. I have discussed this with patient, she will use a wrist splint.  Marlan Palau MD 10/12/2012 7:23 PM  [facility] [address] [place], [place] [address]  Phone [phone] Fax [phone]

## 2012-10-12 NOTE — Procedures (Signed)
  HISTORY:  Shelly Morrison is a 52 year old patient with a history of numbness of the right greater than left upper extremities, and a sensation of swelling in the right hand. The patient being evaluated for possible carpal tunnel syndrome.  NERVE CONDUCTION STUDIES:  Nerve conduction studies were performed on both upper extremities. The distal motor latencies for the median nerves were prolonged on the right, normal on the left, and normal for the ulnar nerves bilaterally. The distal motor latencies and motor amplitudes for the ulnar nerves were normal bilaterally. The F wave latencies and nerve conduction velocities for the median and ulnar nerves were normal bilaterally. The sensory latencies for the median nerves were prolonged on the right, normal on the left, and normal for the ulnar nerves bilaterally.  Nerve conduction studies were performed on the right lower extremity. The distal motor latencies and motor amplitudes for the peroneal and posterior tibial nerves were within normal limits. The nerve conduction velocities for these nerves were also normal. The H reflex latency was normal. The sensory latency for the peroneal nerve was within normal limits.   EMG STUDIES:  EMG evaluation was not performed.  IMPRESSION:  Nerve conduction studies done on both upper extremities and on the right lower extremity revealed no evidence of a peripheral neuropathy. There is evidence of a mild right carpal tunnel syndrome.  Marlan Palau MD 10/12/2012 4:35 PM  [facility] [address] [place], [place] [address]  Phone [phone] Fax [phone]

## 2012-11-02 ENCOUNTER — Telehealth: Payer: Self-pay | Admitting: Neurology

## 2012-11-04 NOTE — Telephone Encounter (Signed)
I called patient. The patient indicates that she is wearing for mild right carpal tunnel syndrome at night. She has noted some numbness on the back of the hand on the right. I have indicated that she needs continue wearing the splint. She has not noted any weakness or clumsiness of the hand.

## 2012-11-04 NOTE — Telephone Encounter (Signed)
I called pt and she bought brace [facility] 10-12-12 and started wearing it that same night.  Top of R hand she noted 10-24-12 that she now has numbness constantly (like bug crawling across skin).  She only wears at night.  Achiness better though.  I mentioned stop wearing brace and see if better?   Better quality of brace?

## 2012-12-16 ENCOUNTER — Other Ambulatory Visit: Payer: Self-pay

## 2013-05-11 ENCOUNTER — Other Ambulatory Visit: Payer: Self-pay

## 2013-05-11 DIAGNOSIS — Z1231 Encounter for screening mammogram for malignant neoplasm of breast: Secondary | ICD-10-CM

## 2013-06-06 ENCOUNTER — Ambulatory Visit
Admission: RE | Admit: 2013-06-06 | Discharge: 2013-06-06 | Disposition: A | Discharge status: Home / Self Care | Payer: Managed Care, Other (non HMO) | Source: Ambulatory Visit

## 2013-06-06 DIAGNOSIS — Z1231 Encounter for screening mammogram for malignant neoplasm of breast: Secondary | ICD-10-CM

## 2013-06-07 ENCOUNTER — Other Ambulatory Visit: Payer: Self-pay | Admitting: Physician Assistant

## 2013-06-07 ENCOUNTER — Other Ambulatory Visit (HOSPITAL_COMMUNITY)
Admission: RE | Admit: 2013-06-07 | Discharge: 2013-06-07 | Disposition: A | Discharge status: Home / Self Care | Payer: Managed Care, Other (non HMO) | Source: Ambulatory Visit | Attending: Family Medicine | Admitting: Family Medicine

## 2013-06-07 DIAGNOSIS — Z124 Encounter for screening for malignant neoplasm of cervix: Secondary | ICD-10-CM | POA: Insufficient documentation

## 2013-07-26 ENCOUNTER — Telehealth: Payer: Self-pay | Admitting: Gastroenterology

## 2013-07-26 NOTE — Telephone Encounter (Signed)
Advised patient of colon path Recall is in

## 2014-05-19 ENCOUNTER — Other Ambulatory Visit: Payer: Self-pay

## 2014-05-19 DIAGNOSIS — Z1231 Encounter for screening mammogram for malignant neoplasm of breast: Secondary | ICD-10-CM

## 2014-06-13 ENCOUNTER — Ambulatory Visit
Admission: RE | Admit: 2014-06-13 | Discharge: 2014-06-13 | Disposition: A | Discharge status: Home / Self Care | Payer: Managed Care, Other (non HMO) | Source: Ambulatory Visit

## 2014-06-13 DIAGNOSIS — Z1231 Encounter for screening mammogram for malignant neoplasm of breast: Secondary | ICD-10-CM

## 2014-08-03 ENCOUNTER — Encounter: Payer: Self-pay | Admitting: Gastroenterology

## 2015-04-30 ENCOUNTER — Other Ambulatory Visit: Payer: Self-pay

## 2015-04-30 DIAGNOSIS — Z1231 Encounter for screening mammogram for malignant neoplasm of breast: Secondary | ICD-10-CM

## 2015-06-18 ENCOUNTER — Ambulatory Visit
Admission: RE | Admit: 2015-06-18 | Discharge: 2015-06-18 | Disposition: A | Discharge status: Home / Self Care | Payer: Managed Care, Other (non HMO) | Source: Ambulatory Visit

## 2015-06-18 DIAGNOSIS — Z1231 Encounter for screening mammogram for malignant neoplasm of breast: Secondary | ICD-10-CM

## 2015-07-30 ENCOUNTER — Other Ambulatory Visit (HOSPITAL_COMMUNITY)
Admission: RE | Admit: 2015-07-30 | Discharge: 2015-07-30 | Disposition: A | Discharge status: Home / Self Care | Payer: Managed Care, Other (non HMO) | Source: Ambulatory Visit | Attending: Family Medicine | Admitting: Family Medicine

## 2015-07-30 ENCOUNTER — Other Ambulatory Visit: Payer: Self-pay | Admitting: Physician Assistant

## 2015-07-30 DIAGNOSIS — Z124 Encounter for screening for malignant neoplasm of cervix: Secondary | ICD-10-CM | POA: Insufficient documentation

## 2015-07-31 LAB — CYTOLOGY - PAP

## 2016-05-22 ENCOUNTER — Other Ambulatory Visit: Payer: Self-pay | Admitting: Physician Assistant

## 2016-05-22 DIAGNOSIS — Z1231 Encounter for screening mammogram for malignant neoplasm of breast: Secondary | ICD-10-CM

## 2016-06-24 ENCOUNTER — Ambulatory Visit
Admission: RE | Admit: 2016-06-24 | Discharge: 2016-06-24 | Disposition: A | Discharge status: Home / Self Care | Payer: Managed Care, Other (non HMO) | Source: Ambulatory Visit | Attending: Physician Assistant | Admitting: Physician Assistant

## 2016-06-24 DIAGNOSIS — Z1231 Encounter for screening mammogram for malignant neoplasm of breast: Secondary | ICD-10-CM

## 2017-05-26 ENCOUNTER — Other Ambulatory Visit: Payer: Self-pay | Admitting: Physician Assistant

## 2017-05-26 DIAGNOSIS — Z1231 Encounter for screening mammogram for malignant neoplasm of breast: Secondary | ICD-10-CM

## 2017-06-14 ENCOUNTER — Other Ambulatory Visit: Payer: Self-pay

## 2017-06-14 ENCOUNTER — Encounter (HOSPITAL_COMMUNITY): Payer: Self-pay | Admitting: Emergency Medicine

## 2017-06-14 ENCOUNTER — Ambulatory Visit (HOSPITAL_COMMUNITY)
Admission: EM | Admit: 2017-06-14 | Discharge: 2017-06-14 | Disposition: A | Discharge status: Home / Self Care | Payer: No Typology Code available for payment source

## 2017-06-14 DIAGNOSIS — Z4802 Encounter for removal of sutures: Secondary | ICD-10-CM

## 2017-06-14 DIAGNOSIS — L237 Allergic contact dermatitis due to plants, except food: Secondary | ICD-10-CM | POA: Diagnosis not present

## 2017-06-14 MED ORDER — PREDNISONE 20 MG PO TABS: ORAL_TABLET | ORAL | 0 refills | Status: DC

## 2017-06-14 MED ORDER — BETAMETHASONE DIPROPIONATE 0.05 % EX OINT: TOPICAL_OINTMENT | Freq: Two times a day (BID) | CUTANEOUS | 0 refills | Status: DC

## 2017-06-14 MED ORDER — PREDNISONE 20 MG PO TABS: ORAL_TABLET | ORAL | 0 refills | Status: AC

## 2017-06-14 NOTE — Discharge Instructions (Signed)
Start the cream as quickly as possible.  If at any point you begin having rash on your face or you feel your rash on your arms is worsening

## 2017-06-14 NOTE — ED Triage Notes (Signed)
Concerns for poison ivy.  Pulled up plants this week.  Rash on arms .    Sutures in right index finger placed at [facility] on [address].  3 sutures in right index finger

## 2017-06-14 NOTE — ED Provider Notes (Signed)
06/14/2017 7:41 PM   DOB: [birth year] / MRN: [id number]  SUBJECTIVE:  Shelly Morrison is a 57 y.o. female presenting for poison ivy about the left anterior forearm.  She has another similar rash starting approximately.  Denies any facial involvement congenital involvement.  Denies any eye itching at this time.  She has some sutures placed about the right second posterior DIP and would like these removed today.  She denies pain about the site.  She is allergic to codeine sulfate; paxil [paroxetine hcl]; wellbutrin [bupropion]; erythromycin; and sulfonamide derivatives.   She  has a past medical history of ADHD (attention deficit hyperactivity disorder), Connective tissue disease ([place]), Disturbance of skin sensation (10/12/2012), Headache(784.0), Herpes simplex virus type 1 (HSV-1) dermatitis, IBS (irritable bowel syndrome), Major depressive disorder, Osteopenia, Osteopenia, and Routine culture positive for herpes simplex virus type 2.    She  reports that she has never smoked. She has never used smokeless tobacco. She reports that she drinks alcohol. She reports that she does not use drugs. She  has no sexual activity history on file. The patient  has a past surgical history that includes Cesarean section and Cholecystectomy.  Her family history is not on file.  Review of Systems  Constitutional: Negative for chills, diaphoresis and fever.  Gastrointestinal: Negative for nausea.  Skin: Positive for itching and rash.  Neurological: Negative for dizziness.    OBJECTIVE:  BP 103/70 (BP Location: Left Arm)   Pulse 81   Temp 98.6 F (37 C) (Oral)   Resp 16   SpO2 99%   Physical Exam  Cardiovascular: Normal rate.  Musculoskeletal: Normal range of motion.  Skin: Rash (Nontender streaking erythematous rash with blisters about the left anterior forearm.) noted.      No results found for this or any previous visit (from the past 72 hour(s)).  No results found.  ASSESSMENT AND  PLAN:  No orders of the defined types were placed in this encounter.    Allergic dermatitis due to poison sumac: I have tried to make the case for topical given the lack of genital and facial involvement.  She would like a prescription for p.o. steroid should she worsen.  Advised that this seems reasonable.  Visit for suture removal      The patient is advised to call or return to clinic if she does not see an improvement in symptoms, or to seek the care of the closest emergency department if she worsens with the above plan.   Philis Fendt, MHS, PA-C 06/14/2017 7:41 PM    Tereasa Coop, PA-C 06/14/17 1942

## 2017-06-29 ENCOUNTER — Ambulatory Visit
Admission: RE | Admit: 2017-06-29 | Discharge: 2017-06-29 | Disposition: A | Discharge status: Home / Self Care | Payer: No Typology Code available for payment source | Source: Ambulatory Visit | Attending: Physician Assistant | Admitting: Physician Assistant

## 2017-06-29 DIAGNOSIS — Z1231 Encounter for screening mammogram for malignant neoplasm of breast: Secondary | ICD-10-CM

## 2017-10-07 ENCOUNTER — Ambulatory Visit (INDEPENDENT_AMBULATORY_CARE_PROVIDER_SITE_OTHER): Payer: No Typology Code available for payment source | Admitting: Physician Assistant

## 2017-10-07 ENCOUNTER — Encounter: Payer: Self-pay | Admitting: Physician Assistant

## 2017-10-07 VITALS — BP 98/60 | HR 88 | Ht 62.0 in | Wt 150.0 lb

## 2017-10-07 DIAGNOSIS — R195 Other fecal abnormalities: Secondary | ICD-10-CM

## 2017-10-07 DIAGNOSIS — Z800 Family history of malignant neoplasm of digestive organs: Secondary | ICD-10-CM

## 2017-10-07 NOTE — Patient Instructions (Signed)
Normal BMI (Body Mass Index- based on height and weight) is between 19 and 25. Your BMI today is Body mass index is 27.44 kg/m. Shelly Morrison Please consider follow up  regarding your BMI with your Primary Care Provider.  Continue Metamucil daily. You have been scheduled for a colonoscopy. Please follow written instructions given to you at your visit today.  Please pick up your prep supplies at the pharmacy within the next 1-3 days. If you use inhalers (even only as needed), please bring them with you on the day of your procedure. Your physician has requested that you go to www.startemmi.com and enter the access code given to you at your visit today. This web site gives a general overview about your procedure. However, you should still follow specific instructions given to you by our office regarding your preparation for the procedure.

## 2017-10-07 NOTE — Progress Notes (Signed)
Subjective:    Patient ID: Shelly Morrison, female    DOB: 1960/06/23, 57 y.o.   MRN: [id number]  HPI "Shelly Morrison" is a pleasant 57 year old white female, new to GI today self-referred to discuss colonoscopy.  Patient is previously known to Dr. Sharlett Iles and was seen here in 2011 for colonoscopy with finding of mild diverticulosis of the left colon and sigmoid She had random biopsies taken because of complaints of loose stools and these were unremarkable.  She also had a 1 cm flat polyp removed from the hepatic flexure which by biopsy was a hyperplastic polyp. Patient has history of previous laparo scopic cholecystectomy, and B12 deficiency as well as depression. She says she has had problems with diarrhea or loose stools off and on for many years, she recently started on weight watchers and began eating less fruit seems to have decreased her diarrhea.  Started taking Metamucil which she believes has helped as well.  Is lost about 15 pounds over the past couple of months intentionally, has started on a new antidepressant, has been exercising regularly and says she feels as well as she has felt for some time.  She is not currently having any ongoing loose stools and she has no complaints of abdominal pain bloating, nausea vomiting cramping melena or hematochezia. She has prior history of GERD but is not requiring any regular medication says changes in her diet have helped that as well. Patient is concerned because her mother, who is in her 33s, was diagnosed with colon cancer last fall and underwent a right hemicolectomy and was found to have stage II adenocarcinoma of the cecum.  Review of Systems Pertinent positive and negative review of systems were noted in the above HPI section.  All other review of systems was otherwise negative.  Outpatient Encounter Medications as of 10/07/2017  Medication Sig  . betamethasone dipropionate (DIPROLENE) 0.05 % ointment Apply topically 2 (two) times daily.  .  Calcium-Vitamin D-Vitamin K (VIACTIV CALCIUM PLUS D) 650-12.5-40 MG-MCG-MCG CHEW Chew 2 tablets by mouth daily.  . cyclobenzaprine (FLEXERIL) 10 MG tablet Take 10 mg by mouth daily as needed for muscle spasms.  Marland Kitchen eletriptan (RELPAX) 40 MG tablet Take 40 mg by mouth as needed for migraine. One tablet by mouth at onset of headache. May repeat in 2 hours if headache persists or recurs.  . naproxen sodium (ANAPROX) 220 MG tablet Take 220 mg by mouth 2 (two) times daily with a meal.   . psyllium (METAMUCIL) 58.6 % powder Take 2 packets by mouth 2 (two) times daily.  . QUEtiapine Fumarate (SEROQUEL PO) Take by mouth.  . valACYclovir (VALTREX) 500 MG tablet Take 500 mg by mouth daily.  . Vilazodone HCl (VIIBRYD PO) Take 1 tablet by mouth daily.  . [DISCONTINUED] hydrOXYzine (ATARAX/VISTARIL) 25 MG tablet Take 25 mg by mouth every 6 (six) hours as needed.   . [DISCONTINUED] Cholecalciferol (VITAMIN D) 2000 UNITS CAPS Take 2,000 Units by mouth daily.  . [DISCONTINUED] escitalopram (LEXAPRO) 10 MG tablet Take 10 mg by mouth daily.  . [DISCONTINUED] Multiple Vitamin (MULTIVITAMIN) tablet Take 1 tablet by mouth daily.  . [DISCONTINUED] Multiple Vitamins-Minerals (CENTRUM SILVER PO) Take by mouth.  . [DISCONTINUED] Omega-3 Fatty Acids (FISH OIL) 1000 MG CAPS Take 1,000 mg by mouth daily.  . [DISCONTINUED] Specialty Vitamins Products (MAGNESIUM, AMINO ACID CHELATE,) 133 MG tablet Take 300 tablets by mouth daily. With meal   No facility-administered encounter medications on file as of 10/07/2017.    Allergies  Allergen Reactions  . Codeine Sulfate Nausea And Vomiting  . Paxil [Paroxetine Hcl]     PAXIL CR  . Wellbutrin [Bupropion]   . Erythromycin     REACTION: N/V  . Sulfonamide Derivatives    Patient Active Problem List   Diagnosis Date Noted  . Disturbance of skin sensation 10/12/2012  . CHOLELITHIASIS 06/28/2009  . VITAMIN B12 DEFICIENCY 06/21/2009  . ESOPHAGEAL REFLUX 06/19/2009  .  DEGENERATIVE JOINT DISEASE 06/19/2009  . DIARRHEA 06/19/2009   Social History   Socioeconomic History  . Marital status: Married    Spouse name: Not on file  . Number of children: 1  . Years of education: Not on file  . Highest education level: Not on file  Occupational History  . Not on file  Social Needs  . Financial resource strain: Not on file  . Food insecurity:    Worry: Not on file    Inability: Not on file  . Transportation needs:    Medical: Not on file    Non-medical: Not on file  Tobacco Use  . Smoking status: Never Smoker  . Smokeless tobacco: Never Used  Substance and Sexual Activity  . Alcohol use: Yes    Comment: 1 to 2 drinks daily  . Drug use: No  . Sexual activity: Not on file  Lifestyle  . Physical activity:    Days per week: Not on file    Minutes per session: Not on file  . Stress: Not on file  Relationships  . Social connections:    Talks on phone: Not on file    Gets together: Not on file    Attends religious service: Not on file    Active member of club or organization: Not on file    Attends meetings of clubs or organizations: Not on file    Relationship status: Not on file  . Intimate partner violence:    Fear of current or ex partner: Not on file    Emotionally abused: Not on file    Physically abused: Not on file    Forced sexual activity: Not on file  Other Topics Concern  . Not on file  Social History Narrative  . Not on file    Shelly Morrison's family history includes Colon cancer in her mother.      Objective:    Vitals:   10/07/17 1111  BP: 98/60  Pulse: 88    Physical Exam; [place] white female in no acute distress, pleasant accompanied by her husband blood pressure 98/60 pulse 88, height 5 foot 2, weight 150, BMI 27.4.  HEENT; nontraumatic normocephalic EOMI PERRLA sclera anicteric oral mucosa moist, neck supple, Cardiovascular; regular rate and rhythm with S1-S2 no murmur rub gallop, Pulmonary; clear bilaterally,  Abdomen; soft, nontender nondistended bowel sounds are active there is no palpable mass or hepatosplenomegaly, she has incisional port scars from lap scopic cholecystectomy, Rectal ;exam not done, Extremities; no clubbing cyanosis or edema skin warm dry, Neuro psych; alert and oriented, grossly nonfocal mood and affect appropriate       Assessment & Plan:   28) 57 year old white female with new family history of colon cancer in her mother diagnosed last year comes in to discuss colon cancer surveillance. Her last colonoscopy was in May 2011 with finding of one hyperplastic polyp and diverticulosis. Patient has had some intermittent mild loose stools improved with decrease in fruit intake and adding fiber supplement not currently problematic  2 )status post laparoscopic cholecystectomy 3) history of  B12 deficiency 4).  Depression  Plan; With family history patient is now indicated for every 5 year surveillance.  She wishes to establish with Dr. Ardis Hughs as her husband is a patient of Dr. Ardis Hughs.  She will be scheduled for colonoscopy with Dr. Ardis Hughs in the [facility].  Procedure was discussed in detail with patient including indications, risks ,and benefits and she is agreeable to proceed. She  will continue Metamucil on a daily basis.  Fidelis S Jillien Yakel PA-C 10/07/2017   Cc: Lennie Odor, PA-C

## 2017-10-07 NOTE — Progress Notes (Signed)
I agree with the above note, plan 

## 2017-11-09 ENCOUNTER — Encounter: Payer: Self-pay | Admitting: Gastroenterology

## 2017-11-10 HISTORY — PX: COLONOSCOPY: SHX174

## 2017-11-18 ENCOUNTER — Encounter: Payer: Self-pay | Admitting: Gastroenterology

## 2017-11-18 ENCOUNTER — Ambulatory Visit (AMBULATORY_SURGERY_CENTER): Payer: No Typology Code available for payment source | Admitting: Gastroenterology

## 2017-11-18 VITALS — BP 94/57 | HR 67 | Temp 98.9°F | Resp 8 | Ht 62.0 in | Wt 150.0 lb

## 2017-11-18 DIAGNOSIS — K5730 Diverticulosis of large intestine without perforation or abscess without bleeding: Secondary | ICD-10-CM

## 2017-11-18 DIAGNOSIS — Z800 Family history of malignant neoplasm of digestive organs: Secondary | ICD-10-CM | POA: Diagnosis present

## 2017-11-18 DIAGNOSIS — D123 Benign neoplasm of transverse colon: Secondary | ICD-10-CM

## 2017-11-18 DIAGNOSIS — Z86010 Personal history of colonic polyps: Secondary | ICD-10-CM

## 2017-11-18 DIAGNOSIS — K635 Polyp of colon: Secondary | ICD-10-CM

## 2017-11-18 MED ORDER — SODIUM CHLORIDE 0.9 % IV SOLN: 500.0000 mL | Freq: Once | INTRAVENOUS | Status: DC

## 2017-11-18 NOTE — Op Note (Signed)
[facility] Patient Name: Shelly Morrison Procedure Date: 11/18/2017 3:14 PM MRN: [id number] Endoscopist: Milus Banister , MD Age: 57 Referring MD:  Date of Birth: May 20, 1960 Gender: Female Account #: [id number] Procedure:                Colonoscopy Indications:              Screening patient at increased risk: Family history                            of 1st-degree relative with colorectal cancer at                            age 3 years (or older); colonoscopy 2011 Dr.                            Sharlett Iles single hyperplastic polyp, left sided                            diverticulosis Medicines:                Monitored Anesthesia Care Procedure:                Pre-Anesthesia Assessment:                           - Prior to the procedure, a History and Physical                            was performed, and patient medications and                            allergies were reviewed. The patient's tolerance of                            previous anesthesia was also reviewed. The risks                            and benefits of the procedure and the sedation                            options and risks were discussed with the patient.                            All questions were answered, and informed consent                            was obtained. Prior Anticoagulants: The patient has                            taken no previous anticoagulant or antiplatelet                            agents. ASA Grade Assessment: II - A patient with  mild systemic disease. After reviewing the risks                            and benefits, the patient was deemed in                            satisfactory condition to undergo the procedure.                           After obtaining informed consent, the colonoscope                            was passed under direct vision. Throughout the                            procedure, the patient's blood pressure, pulse, and                           oxygen saturations were monitored continuously. The                            Colonoscope was introduced through the anus and                            advanced to the the cecum, identified by                            appendiceal orifice and ileocecal valve. The                            colonoscopy was performed without difficulty. The                            patient tolerated the procedure well. The quality                            of the bowel preparation was good. The ileocecal                            valve, appendiceal orifice, and rectum were                            photographed. Scope In: 3:27:20 PM Scope Out: 3:39:38 PM Scope Withdrawal Time: 0 hours 9 minutes 5 seconds  Total Procedure Duration: 0 hours 12 minutes 18 seconds  Findings:                 A 1 mm polyp was found in the transverse colon. The                            polyp was sessile. The polyp was removed with a                            cold snare. Resection was complete, but the polyp  tissue was not retrieved.                           A few small and large-mouthed diverticula were                            found in the left colon.                           The exam was otherwise without abnormality on                            direct and retroflexion views. Complications:            No immediate complications. Estimated blood loss:                            None. Estimated Blood Loss:     Estimated blood loss: none. Impression:               - One 1 mm polyp in the transverse colon, removed                            with a cold snare. Complete resection. Polyp tissue                            not retrieved.                           - Diverticulosis in the left colon.                           - The examination was otherwise normal on direct                            and retroflexion views. Recommendation:           - Patient has a  contact number available for                            emergencies. The signs and symptoms of potential                            delayed complications were discussed with the                            patient. Return to normal activities tomorrow.                            Written discharge instructions were provided to the                            patient.                           - Resume previous diet.                           -  Continue present medications.                           - Repeat colonoscopy in 5 years for surveillance                            (will presume the 23mm polyp that was removed but                            not retrieved was indeed adenomatous). Milus Banister, MD 11/18/2017 3:43:07 PM This report has been signed electronically.

## 2017-11-18 NOTE — Progress Notes (Signed)
Called to room to assist during endoscopic procedure.  Patient ID and intended procedure confirmed with present staff. Received instructions for my participation in the procedure from the performing physician.  

## 2017-11-18 NOTE — Progress Notes (Signed)
Pt's states no medical or surgical changes since previsit or office visit. 

## 2017-11-18 NOTE — Patient Instructions (Signed)
Handouts:  Polyps and Diverticulosis   YOU HAD AN ENDOSCOPIC PROCEDURE TODAY AT THE [facility]:   Refer to the procedure report that was given to you for any specific questions about what was found during the examination.  If the procedure report does not answer your questions, please call your gastroenterologist to clarify.  If you requested that your care partner not be given the details of your procedure findings, then the procedure report has been included in a sealed envelope for you to review at your convenience later.  YOU SHOULD EXPECT: Some feelings of bloating in the abdomen. Passage of more gas than usual.  Walking can help get rid of the air that was put into your GI tract during the procedure and reduce the bloating. If you had a lower endoscopy (such as a colonoscopy or flexible sigmoidoscopy) you may notice spotting of blood in your stool or on the toilet paper. If you underwent a bowel prep for your procedure, you may not have a normal bowel movement for a few days.  Please Note:  You might notice some irritation and congestion in your nose or some drainage.  This is from the oxygen used during your procedure.  There is no need for concern and it should clear up in a day or so.  SYMPTOMS TO REPORT IMMEDIATELY:   Following lower endoscopy (colonoscopy or flexible sigmoidoscopy):  Excessive amounts of blood in the stool  Significant tenderness or worsening of abdominal pains  Swelling of the abdomen that is new, acute  Fever of 100F or higher  For urgent or emergent issues, a gastroenterologist can be reached at any hour by calling [phone].   DIET:  We do recommend a small meal at first, but then you may proceed to your regular diet.  Drink plenty of fluids but you should avoid alcoholic beverages for 24 hours.  ACTIVITY:  You should plan to take it easy for the rest of today and you should NOT DRIVE or use heavy machinery until tomorrow (because of the  sedation medicines used during the test).    FOLLOW UP: Our staff will call the number listed on your records the next business day following your procedure to check on you and address any questions or concerns that you may have regarding the information given to you following your procedure. If we do not reach you, we will leave a message.  However, if you are feeling well and you are not experiencing any problems, there is no need to return our call.  We will assume that you have returned to your regular daily activities without incident.  If any biopsies were taken you will be contacted by phone or by letter within the next 1-3 weeks.  Please call us at [phone] if you have not heard about the biopsies in 3 weeks.    SIGNATURES/CONFIDENTIALITY: You and/or your care partner have signed paperwork which will be entered into your electronic medical record.  These signatures attest to the fact that that the information above on your After Visit Summary has been reviewed and is understood.  Full responsibility of the confidentiality of this discharge information lies with you and/or your care-partner. 

## 2017-11-18 NOTE — Progress Notes (Signed)
A and O x3. Report to RN. Tolerated MAC anesthesia well.

## 2017-11-19 ENCOUNTER — Telehealth: Payer: Self-pay | Admitting: *Deleted

## 2017-11-19 NOTE — Telephone Encounter (Signed)
  Follow up Call-  Call back number 11/18/2017  Post procedure Call Back phone  # [phone]  Permission to leave phone message Yes  Some recent data might be hidden     Patient questions:  Do you have a fever, pain , or abdominal swelling? No. Pain Score  0 *  Have you tolerated food without any problems? Yes.    Have you been able to return to your normal activities? Yes.    Do you have any questions about your discharge instructions: Diet   No. Medications  No. Follow up visit  No.  Do you have questions or concerns about your Care? No.  Actions: * If pain score is 4 or above: No action needed, pain <4.  Pt. Stated "I feel like a million bucks".

## 2018-08-17 ENCOUNTER — Other Ambulatory Visit: Payer: Self-pay | Admitting: Physician Assistant

## 2018-08-17 DIAGNOSIS — Z1231 Encounter for screening mammogram for malignant neoplasm of breast: Secondary | ICD-10-CM

## 2018-08-20 ENCOUNTER — Other Ambulatory Visit: Payer: Self-pay

## 2018-08-20 ENCOUNTER — Ambulatory Visit
Admission: RE | Admit: 2018-08-20 | Discharge: 2018-08-20 | Disposition: A | Discharge status: Home / Self Care | Payer: No Typology Code available for payment source | Source: Ambulatory Visit | Attending: Physician Assistant | Admitting: Physician Assistant

## 2018-08-20 DIAGNOSIS — Z1231 Encounter for screening mammogram for malignant neoplasm of breast: Secondary | ICD-10-CM

## 2019-07-14 ENCOUNTER — Other Ambulatory Visit: Payer: Self-pay | Admitting: Physician Assistant

## 2019-07-14 DIAGNOSIS — Z1231 Encounter for screening mammogram for malignant neoplasm of breast: Secondary | ICD-10-CM

## 2019-08-22 ENCOUNTER — Other Ambulatory Visit: Payer: Self-pay

## 2019-08-22 ENCOUNTER — Ambulatory Visit
Admission: RE | Admit: 2019-08-22 | Discharge: 2019-08-22 | Disposition: A | Discharge status: Home / Self Care | Payer: No Typology Code available for payment source | Source: Ambulatory Visit | Attending: Physician Assistant | Admitting: Physician Assistant

## 2019-08-22 DIAGNOSIS — Z1231 Encounter for screening mammogram for malignant neoplasm of breast: Secondary | ICD-10-CM

## 2019-09-01 ENCOUNTER — Other Ambulatory Visit (HOSPITAL_COMMUNITY)
Admission: RE | Admit: 2019-09-01 | Discharge: 2019-09-01 | Disposition: A | Discharge status: Home / Self Care | Payer: No Typology Code available for payment source | Source: Ambulatory Visit | Attending: Physician Assistant | Admitting: Physician Assistant

## 2019-09-01 DIAGNOSIS — Z124 Encounter for screening for malignant neoplasm of cervix: Secondary | ICD-10-CM | POA: Diagnosis present

## 2019-09-05 LAB — CYTOLOGY - PAP: Diagnosis: NEGATIVE

## 2019-10-05 ENCOUNTER — Other Ambulatory Visit: Payer: Self-pay

## 2019-10-05 ENCOUNTER — Other Ambulatory Visit: Payer: No Typology Code available for payment source

## 2019-10-05 DIAGNOSIS — Z20822 Contact with and (suspected) exposure to covid-19: Secondary | ICD-10-CM

## 2019-10-06 LAB — SARS-COV-2, NAA 2 DAY TAT

## 2019-10-06 LAB — NOVEL CORONAVIRUS, NAA: SARS-CoV-2, NAA: NOT DETECTED

## 2019-10-10 ENCOUNTER — Other Ambulatory Visit: Payer: Self-pay | Admitting: Physician Assistant

## 2019-10-10 ENCOUNTER — Ambulatory Visit
Admission: RE | Admit: 2019-10-10 | Discharge: 2019-10-10 | Disposition: A | Discharge status: Home / Self Care | Payer: No Typology Code available for payment source | Source: Ambulatory Visit | Attending: Physician Assistant | Admitting: Physician Assistant

## 2019-10-10 DIAGNOSIS — R059 Cough, unspecified: Secondary | ICD-10-CM

## 2019-11-21 ENCOUNTER — Other Ambulatory Visit: Payer: No Typology Code available for payment source

## 2019-11-21 DIAGNOSIS — Z20822 Contact with and (suspected) exposure to covid-19: Secondary | ICD-10-CM

## 2019-11-23 LAB — SARS-COV-2, NAA 2 DAY TAT

## 2019-11-23 LAB — NOVEL CORONAVIRUS, NAA: SARS-CoV-2, NAA: NOT DETECTED

## 2020-05-08 ENCOUNTER — Ambulatory Visit: Payer: No Typology Code available for payment source | Attending: Internal Medicine

## 2020-05-08 DIAGNOSIS — Z20822 Contact with and (suspected) exposure to covid-19: Secondary | ICD-10-CM

## 2020-05-09 LAB — NOVEL CORONAVIRUS, NAA: SARS-CoV-2, NAA: NOT DETECTED

## 2020-05-09 LAB — SARS-COV-2, NAA 2 DAY TAT

## 2020-05-15 ENCOUNTER — Ambulatory Visit: Payer: No Typology Code available for payment source | Attending: Critical Care Medicine

## 2020-05-15 DIAGNOSIS — Z20822 Contact with and (suspected) exposure to covid-19: Secondary | ICD-10-CM

## 2020-05-16 LAB — SARS-COV-2, NAA 2 DAY TAT

## 2020-05-16 LAB — NOVEL CORONAVIRUS, NAA: SARS-CoV-2, NAA: NOT DETECTED

## 2020-05-17 ENCOUNTER — Ambulatory Visit: Payer: No Typology Code available for payment source | Attending: Critical Care Medicine

## 2020-05-17 DIAGNOSIS — Z20822 Contact with and (suspected) exposure to covid-19: Secondary | ICD-10-CM

## 2020-05-18 LAB — SARS-COV-2, NAA 2 DAY TAT

## 2020-05-18 LAB — NOVEL CORONAVIRUS, NAA: SARS-CoV-2, NAA: NOT DETECTED

## 2020-06-12 ENCOUNTER — Ambulatory Visit: Payer: No Typology Code available for payment source | Attending: Critical Care Medicine

## 2020-06-12 DIAGNOSIS — Z20822 Contact with and (suspected) exposure to covid-19: Secondary | ICD-10-CM

## 2020-06-13 LAB — NOVEL CORONAVIRUS, NAA: SARS-CoV-2, NAA: NOT DETECTED

## 2020-06-21 ENCOUNTER — Ambulatory Visit: Payer: No Typology Code available for payment source | Attending: Internal Medicine

## 2020-06-21 DIAGNOSIS — Z20822 Contact with and (suspected) exposure to covid-19: Secondary | ICD-10-CM

## 2020-06-23 LAB — SARS-COV-2, NAA 2 DAY TAT

## 2020-06-23 LAB — NOVEL CORONAVIRUS, NAA: SARS-CoV-2, NAA: NOT DETECTED

## 2020-07-23 ENCOUNTER — Other Ambulatory Visit: Payer: Self-pay | Admitting: Physician Assistant

## 2020-07-23 ENCOUNTER — Ambulatory Visit: Payer: No Typology Code available for payment source | Attending: Critical Care Medicine

## 2020-07-23 ENCOUNTER — Other Ambulatory Visit: Payer: Self-pay | Admitting: Anesthesiology

## 2020-07-23 DIAGNOSIS — Z1231 Encounter for screening mammogram for malignant neoplasm of breast: Secondary | ICD-10-CM

## 2020-07-23 DIAGNOSIS — Z20822 Contact with and (suspected) exposure to covid-19: Secondary | ICD-10-CM

## 2020-07-24 LAB — NOVEL CORONAVIRUS, NAA: SARS-CoV-2, NAA: NOT DETECTED

## 2020-07-24 LAB — SARS-COV-2, NAA 2 DAY TAT

## 2020-09-17 ENCOUNTER — Other Ambulatory Visit: Payer: Self-pay

## 2020-09-17 ENCOUNTER — Ambulatory Visit
Admission: RE | Admit: 2020-09-17 | Discharge: 2020-09-17 | Disposition: A | Discharge status: Home / Self Care | Payer: No Typology Code available for payment source | Source: Ambulatory Visit | Attending: Physician Assistant | Admitting: Physician Assistant

## 2020-09-17 DIAGNOSIS — Z1231 Encounter for screening mammogram for malignant neoplasm of breast: Secondary | ICD-10-CM

## 2021-04-15 ENCOUNTER — Ambulatory Visit
Admission: RE | Admit: 2021-04-15 | Discharge: 2021-04-15 | Disposition: A | Discharge status: Home / Self Care | Payer: No Typology Code available for payment source | Source: Ambulatory Visit | Attending: Physician Assistant | Admitting: Physician Assistant

## 2021-04-15 ENCOUNTER — Other Ambulatory Visit: Payer: Self-pay | Admitting: Physician Assistant

## 2021-04-15 DIAGNOSIS — R059 Cough, unspecified: Secondary | ICD-10-CM

## 2021-08-16 ENCOUNTER — Other Ambulatory Visit: Payer: Self-pay | Admitting: Physician Assistant

## 2021-08-16 DIAGNOSIS — Z1231 Encounter for screening mammogram for malignant neoplasm of breast: Secondary | ICD-10-CM

## 2021-09-18 ENCOUNTER — Ambulatory Visit
Admission: RE | Admit: 2021-09-18 | Discharge: 2021-09-18 | Disposition: A | Discharge status: Home / Self Care | Payer: No Typology Code available for payment source | Source: Ambulatory Visit | Attending: Physician Assistant | Admitting: Physician Assistant

## 2021-09-18 DIAGNOSIS — Z1231 Encounter for screening mammogram for malignant neoplasm of breast: Secondary | ICD-10-CM

## 2022-02-06 ENCOUNTER — Other Ambulatory Visit: Payer: Self-pay | Admitting: Physician Assistant

## 2022-02-06 DIAGNOSIS — Z13820 Encounter for screening for osteoporosis: Secondary | ICD-10-CM

## 2022-02-21 ENCOUNTER — Ambulatory Visit
Admission: RE | Admit: 2022-02-21 | Discharge: 2022-02-21 | Disposition: A | Discharge status: Home / Self Care | Payer: No Typology Code available for payment source | Source: Ambulatory Visit | Attending: Physician Assistant | Admitting: Physician Assistant

## 2022-02-21 DIAGNOSIS — Z13820 Encounter for screening for osteoporosis: Secondary | ICD-10-CM

## 2022-10-03 ENCOUNTER — Other Ambulatory Visit: Payer: Self-pay | Admitting: Physician Assistant

## 2022-10-03 ENCOUNTER — Ambulatory Visit
Admission: RE | Admit: 2022-10-03 | Discharge: 2022-10-03 | Disposition: A | Discharge status: Home / Self Care | Payer: No Typology Code available for payment source | Source: Ambulatory Visit

## 2022-10-03 DIAGNOSIS — Z1231 Encounter for screening mammogram for malignant neoplasm of breast: Secondary | ICD-10-CM

## 2022-10-07 ENCOUNTER — Other Ambulatory Visit: Payer: Self-pay | Admitting: Physician Assistant

## 2022-10-07 DIAGNOSIS — R928 Other abnormal and inconclusive findings on diagnostic imaging of breast: Secondary | ICD-10-CM

## 2022-10-15 ENCOUNTER — Ambulatory Visit: Payer: No Typology Code available for payment source

## 2022-10-15 ENCOUNTER — Ambulatory Visit
Admission: RE | Admit: 2022-10-15 | Discharge: 2022-10-15 | Disposition: A | Discharge status: Home / Self Care | Payer: No Typology Code available for payment source | Source: Ambulatory Visit | Attending: Physician Assistant | Admitting: Physician Assistant

## 2022-10-15 DIAGNOSIS — R928 Other abnormal and inconclusive findings on diagnostic imaging of breast: Secondary | ICD-10-CM

## 2023-02-18 ENCOUNTER — Encounter: Payer: Self-pay | Admitting: Gastroenterology

## 2023-04-06 ENCOUNTER — Ambulatory Visit (AMBULATORY_SURGERY_CENTER): Payer: No Typology Code available for payment source

## 2023-04-06 VITALS — Ht 62.0 in | Wt 160.0 lb

## 2023-04-06 DIAGNOSIS — Z800 Family history of malignant neoplasm of digestive organs: Secondary | ICD-10-CM

## 2023-04-06 MED ORDER — SUFLAVE 178.7 G PO SOLR: 1.0000 | ORAL | 0 refills | Status: DC

## 2023-04-06 NOTE — Progress Notes (Signed)

## 2023-04-15 ENCOUNTER — Encounter: Payer: Self-pay | Admitting: Gastroenterology

## 2023-04-20 ENCOUNTER — Telehealth: Payer: Self-pay | Admitting: Gastroenterology

## 2023-04-20 ENCOUNTER — Encounter: Payer: Self-pay | Admitting: Gastroenterology

## 2023-04-20 ENCOUNTER — Ambulatory Visit: Payer: No Typology Code available for payment source | Admitting: Gastroenterology

## 2023-04-20 VITALS — BP 101/71 | HR 78 | Temp 97.7°F | Resp 19 | Ht 62.0 in | Wt 160.0 lb

## 2023-04-20 DIAGNOSIS — Z1211 Encounter for screening for malignant neoplasm of colon: Secondary | ICD-10-CM

## 2023-04-20 DIAGNOSIS — K5730 Diverticulosis of large intestine without perforation or abscess without bleeding: Secondary | ICD-10-CM

## 2023-04-20 DIAGNOSIS — Z800 Family history of malignant neoplasm of digestive organs: Secondary | ICD-10-CM | POA: Diagnosis not present

## 2023-04-20 DIAGNOSIS — Z860100 Personal history of colon polyps, unspecified: Secondary | ICD-10-CM

## 2023-04-20 DIAGNOSIS — R5082 Postprocedural fever: Secondary | ICD-10-CM

## 2023-04-20 DIAGNOSIS — D120 Benign neoplasm of cecum: Secondary | ICD-10-CM

## 2023-04-20 DIAGNOSIS — R051 Acute cough: Secondary | ICD-10-CM

## 2023-04-20 MED ORDER — AMOXICILLIN-POT CLAVULANATE 875-125 MG PO TABS: 1.0000 | ORAL_TABLET | Freq: Two times a day (BID) | ORAL | 0 refills | Status: AC

## 2023-04-20 MED ORDER — SODIUM CHLORIDE 0.9 % IV SOLN: 500.0000 mL | Freq: Once | INTRAVENOUS | Status: DC

## 2023-04-20 NOTE — Telephone Encounter (Signed)
 Inbound call from patient stating she started running a fever of 100.3 after this morning's colonoscopy. States she also has chills and a cough. Patient is requesting a call back. Please advise, thank you.

## 2023-04-20 NOTE — Progress Notes (Unsigned)
 1131 patient coughing, with sputum, thin and clear. Suctioned oral pharynx for approximately 20 cc's clear fluid, face mask at 10 l/mi applied to patient

## 2023-04-20 NOTE — Telephone Encounter (Signed)
 Returned pts call.  She states that she has been cold with chills since she got home from procedure.  She also reports a most recent temperature of 100.6 and a cough.  Dr. Barron Alvine please advise.

## 2023-04-20 NOTE — Telephone Encounter (Signed)
 Phoned pt and advised her of symptoms to report.  Instructed her that Dr. Barron Alvine ordered a chest xray and Augmentin for 7 days.  Pt verbalized understanding.

## 2023-04-20 NOTE — Patient Instructions (Signed)

## 2023-04-20 NOTE — Op Note (Signed)
 [facility] Patient Name: Shelly Morrison Procedure Date: 04/20/2023 11:23 AM MRN: [id number] Endoscopist: Doristine Locks , MD, [id number] Age: 63 Referring MD:  Date of Birth: 1960/05/21 Gender: Female Account #: [id number] Procedure:                Colonoscopy Indications:              High risk colon cancer surveillance: Personal                            history of colonic polyps                           Last colonoscopy was 11/2017 and notable for 1 mm                            transverse colon polyp that was removed but not                            retrieved, and recommended repeat in 5 years.                           Prior to that, colonoscopy in 06/2009 with 1 cm                            polyp removed from hepatic flexure (path:                            hyperplastic polyp) and left sided diverticulosis.                           Fhx notable for mother with colon cancer diagnosed                            in her 27's. Medicines:                Monitored Anesthesia Care Procedure:                Pre-Anesthesia Assessment:                           - Prior to the procedure, a History and Physical                            was performed, and patient medications and                            allergies were reviewed. The patient's tolerance of                            previous anesthesia was also reviewed. The risks                            and benefits of the procedure and the sedation  options and risks were discussed with the patient.                            All questions were answered, and informed consent                            was obtained. Prior Anticoagulants: The patient has                            taken no anticoagulant or antiplatelet agents. ASA                            Grade Assessment: II - A patient with mild systemic                            disease. After reviewing the risks and benefits,                             the patient was deemed in satisfactory condition to                            undergo the procedure.                           After obtaining informed consent, the colonoscope                            was passed under direct vision. Throughout the                            procedure, the patient's blood pressure, pulse, and                            oxygen saturations were monitored continuously. The                            CF [id number] #[id number] was introduced through the anus                            and advanced to the the cecum, identified by                            appendiceal orifice and ileocecal valve. The                            colonoscopy was performed without difficulty. The                            patient tolerated the procedure well. The quality                            of the bowel preparation was good. The ileocecal  valve, appendiceal orifice, and rectum were                            photographed. Scope In: 11:28:14 AM Scope Out: 11:42:24 AM Scope Withdrawal Time: 0 hours 10 minutes 37 seconds  Total Procedure Duration: 0 hours 14 minutes 10 seconds  Findings:                 The perianal and digital rectal examinations were                            normal.                           A 6 mm polyp was found in the cecum. The polyp was                            mucous-capped and sessile. The polyp was removed                            with a cold snare. Resection and retrieval were                            complete. Estimated blood loss was minimal.                           Multiple small-mouthed diverticula were found in                            the sigmoid colon.                           The retroflexed view of the distal rectum and anal                            verge was normal and showed no anal or rectal                            abnormalities. Complications:            No immediate  complications. Estimated Blood Loss:     Estimated blood loss was minimal. Impression:               - One 6 mm polyp in the cecum, removed with a cold                            snare. Resected and retrieved.                           - Diverticulosis in the sigmoid colon.                           - The distal rectum and anal verge are normal on                            retroflexion view. Recommendation:           -  Patient has a contact number available for                            emergencies. The signs and symptoms of potential                            delayed complications were discussed with the                            patient. Return to normal activities tomorrow.                            Written discharge instructions were provided to the                            patient.                           - Resume previous diet.                           - Continue present medications.                           - Await pathology results.                           - Repeat colonoscopy for surveillance based on                            pathology results.                           - Return to GI office PRN. Doristine Locks, MD 04/20/2023 11:47:04 AM

## 2023-04-20 NOTE — Progress Notes (Unsigned)
 Pt's states no medical or surgical changes since previsit or office visit.

## 2023-04-20 NOTE — Telephone Encounter (Signed)
 She did have cough after completion of the procedure and required suction. Out of abundance of caution, please send in Rx for Augmentin 875 mg/125 mg ER BID x7 days, #14, RF0. Please obtain CXR. If fever, chills, SOB, increased, cough, or other concerns, please recommend returning to ER for expedited evaluation. Thanks.

## 2023-04-20 NOTE — Progress Notes (Unsigned)
 Called to room to assist during endoscopic procedure.  Patient ID and intended procedure confirmed with present staff. Received instructions for my participation in the procedure from the performing physician.

## 2023-04-20 NOTE — Progress Notes (Unsigned)
 GASTROENTEROLOGY PROCEDURE H&P NOTE   Primary Care Physician: Milus Height, PA    Reason for Procedure:  Colon polyp surveillance, Family history colon cancer  Plan:    Colonoscopy  Patient is appropriate for endoscopic procedure(s) in the ambulatory ([facility]) setting.  The nature of the procedure, as well as the risks, benefits, and alternatives were carefully and thoroughly reviewed with the patient. Ample time for discussion and questions allowed. The patient understood, was satisfied, and agreed to proceed.     HPI: Shelly Morrison is a 63 y.o. female who presents for colonoscopy for ongoing colon polyp surveillance and colon cancer screening.  No active GI symptoms.   Last colonoscopy was 11/2017 and notable for 1 mm transverse colon polyp that was removed but not retrieved, and recommended repeat in 5 years.   Prior to that, colonoscopy in 06/2009 with 1 cm polyp removed from hepatic flexure (path: hyperplastic polyp) and left sided diverticulosis.   Fhx notable for mother with colon cancer diagnosed in her 23's.   Past Medical History:  Diagnosis Date   ADHD (attention deficit hyperactivity disorder)    Connective tissue disease (HCC)    Disturbance of skin sensation 10/12/2012   Headache(784.0)    Herpes simplex virus type 1 (HSV-1) dermatitis    IBS (irritable bowel syndrome)    Major depressive disorder    Osteopenia    Osteopenia    periarticular of hands   Routine culture positive for herpes simplex virus type 2    Skin cancer (melanoma) (HCC)    On forehead    Past Surgical History:  Procedure Laterality Date   CESAREAN SECTION     CHOLECYSTECTOMY     COLONOSCOPY  11/2017    Prior to Admission medications   Medication Sig Start Date End Date Taking? Authorizing Provider  Calcium Carb-Cholecalciferol (CALCIUM 600/VITAMIN D3 PO) Take 1 tablet by mouth daily at 6 (six) AM.   Yes [provider]  Glucosamine-Chondroitin (GLUCOSAMINE CHONDR  COMPLEX PO) Take 2 tablets by mouth daily.   Yes [provider]  Magnesium 400 MG CAPS Take 1 capsule by mouth daily.   Yes [provider]  Probiotic Product (PROBIOTIC PO) Take 1 capsule by mouth daily. Pro gel   Yes [provider]  SEROQUEL 100 MG tablet Take 100 mg by mouth at bedtime.   Yes [provider]  Turmeric (QC TUMERIC COMPLEX) 500 MG CAPS Take 1 capsule by mouth daily.   Yes [provider]  valACYclovir (VALTREX) 500 MG tablet Take 500 mg by mouth daily.   Yes [provider]  Vilazodone HCl (VIIBRYD) 40 MG TABS Take 40 mg by mouth daily. 03/13/23  Yes [provider]  BIOTIN PO Take 500 mcg by mouth daily at 6 (six) AM.    [provider]  Cranberry 500 MG CAPS Take 1 capsule by mouth daily at 6 (six) AM.    [provider]  Cyanocobalamin 1000 MCG TBCR Take 1 tablet by mouth daily.    [provider]  eletriptan (RELPAX) 40 MG tablet Take 40 mg by mouth as needed for migraine. One tablet by mouth at onset of headache. May repeat in 2 hours if headache persists or recurs.    [provider]  escitalopram (LEXAPRO) 10 MG tablet Take 10 mg by mouth daily. Patient not taking: Reported on 04/20/2023    [provider]  folic acid (FOLVITE) 400 MCG tablet Take 1 tablet by mouth daily. 03/21/22  [provider]  methocarbamol (ROBAXIN) 750 MG tablet Take 1 tablet by mouth every 6 (six) hours as needed.    [provider]  naproxen sodium (ANAPROX) 220 MG tablet Take 220 mg by mouth daily as needed.    [provider]  Omega-3 Fatty Acids (OMEGA 3 PO) Take 2 tablets by mouth daily.    [provider]  psyllium (METAMUCIL) 58.6 % powder Take 2 packets by mouth 2 (two) times daily. Patient not taking: Reported on 04/20/2023    [provider]    Current Outpatient Medications  Medication Sig Dispense Refill   Calcium Carb-Cholecalciferol  (CALCIUM 600/VITAMIN D3 PO) Take 1 tablet by mouth daily at 6 (six) AM.     Glucosamine-Chondroitin (GLUCOSAMINE CHONDR COMPLEX PO) Take 2 tablets by mouth daily.     Magnesium 400 MG CAPS Take 1 capsule by mouth daily.     Probiotic Product (PROBIOTIC PO) Take 1 capsule by mouth daily. Pro gel     SEROQUEL 100 MG tablet Take 100 mg by mouth at bedtime.     Turmeric (QC TUMERIC COMPLEX) 500 MG CAPS Take 1 capsule by mouth daily.     valACYclovir (VALTREX) 500 MG tablet Take 500 mg by mouth daily.     Vilazodone HCl (VIIBRYD) 40 MG TABS Take 40 mg by mouth daily.     BIOTIN PO Take 500 mcg by mouth daily at 6 (six) AM.     Cranberry 500 MG CAPS Take 1 capsule by mouth daily at 6 (six) AM.     Cyanocobalamin 1000 MCG TBCR Take 1 tablet by mouth daily.     eletriptan (RELPAX) 40 MG tablet Take 40 mg by mouth as needed for migraine. One tablet by mouth at onset of headache. May repeat in 2 hours if headache persists or recurs.     escitalopram (LEXAPRO) 10 MG tablet Take 10 mg by mouth daily. (Patient not taking: Reported on 04/20/2023)     folic acid (FOLVITE) 400 MCG tablet Take 1 tablet by mouth daily.     methocarbamol (ROBAXIN) 750 MG tablet Take 1 tablet by mouth every 6 (six) hours as needed.     naproxen sodium (ANAPROX) 220 MG tablet Take 220 mg by mouth daily as needed.     Omega-3 Fatty Acids (OMEGA 3 PO) Take 2 tablets by mouth daily.     psyllium (METAMUCIL) 58.6 % powder Take 2 packets by mouth 2 (two) times daily. (Patient not taking: Reported on 04/20/2023)     Current Facility-Administered Medications  Medication Dose Route Frequency Provider Last Rate Last Admin   0.9 %  sodium chloride infusion  500 mL Intravenous Once Denice Cardon V, DO        Allergies as of 04/20/2023 - Review Complete 04/20/2023  Allergen Reaction Noted   Codeine sulfate Nausea And Vomiting 10/08/2012   Paxil [paroxetine hcl]  10/08/2012   Poison ivy extract Itching 03/24/2022   Wellbutrin  [bupropion]  10/08/2012   Erythromycin Nausea And Vomiting    Sulfonamide derivatives Nausea And Vomiting     Family History  Problem Relation Age of Onset   Colon cancer Mother        Fall 2018   Esophageal cancer Neg Hx    Rectal cancer Neg Hx    Stomach cancer Neg Hx     Social History   Socioeconomic History   Marital status: Married    Spouse name: Not on file   Number of children:  1   Years of education: Not on file   Highest education level: Not on file  Occupational History   Not on file  Tobacco Use   Smoking status: Never   Smokeless tobacco: Never  Vaping Use   Vaping status: Never Used  Substance and Sexual Activity   Alcohol use: Yes    Comment: 1 to 2 drinks daily   Drug use: No   Sexual activity: Not on file  Other Topics Concern   Not on file  Social History Narrative   Not on file   Social Drivers of Health   Financial Resource Strain: Not on file  Food Insecurity: Not on file  Transportation Needs: Not on file  Physical Activity: Not on file  Stress: Not on file  Social Connections: Not on file  Intimate Partner Violence: Not on file    Physical Exam: Vital signs in last 24 hours: @BP  (!) 137/49   Pulse 69   Temp 97.7 F (36.5 C) (Skin)   Ht 5\' 2"  (1.575 m)   Wt 160 lb (72.6 kg)   SpO2 98%   BMI 29.26 kg/m  GEN: NAD EYE: Sclerae anicteric ENT: MMM CV: Non-tachycardic Pulm: CTA b/l GI: Soft, NT/ND NEURO:  Alert & Oriented x 3   Doristine Locks, DO [facility]   04/20/2023 11:21 AM

## 2023-04-21 ENCOUNTER — Telehealth: Payer: Self-pay

## 2023-04-21 ENCOUNTER — Encounter: Payer: Self-pay | Admitting: Gastroenterology

## 2023-04-21 ENCOUNTER — Ambulatory Visit (INDEPENDENT_AMBULATORY_CARE_PROVIDER_SITE_OTHER)
Admission: RE | Admit: 2023-04-21 | Discharge: 2023-04-21 | Disposition: A | Discharge status: Home / Self Care | Source: Ambulatory Visit | Attending: Gastroenterology | Admitting: Gastroenterology

## 2023-04-21 DIAGNOSIS — R5082 Postprocedural fever: Secondary | ICD-10-CM

## 2023-04-21 DIAGNOSIS — R051 Acute cough: Secondary | ICD-10-CM | POA: Diagnosis not present

## 2023-04-21 NOTE — Telephone Encounter (Signed)
  Follow up Call-     04/20/2023   10:44 AM  Call back number  Post procedure Call Back phone  # [phone]  Permission to leave phone message Yes     Patient questions:  Do you have a fever, pain , or abdominal swelling? No. Pain Score  0 *  Have you tolerated food without any problems? Yes.    Have you been able to return to your normal activities? Yes.    Do you have any questions about your discharge instructions: Diet   No. Medications  No. Follow up visit  No.  Do you have questions or concerns about your Care? No.  Actions: * If pain score is 4 or above: No action needed, pain <4. Patient reports feeling "more like herself today", she is taking the ABX, no fever, no cough. She plans to come in to get the chest xray today.

## 2023-04-21 NOTE — Telephone Encounter (Signed)
 Called patient to follow up this morning. Patient states that she just talked with the [facility]. Patient is doing well this morning. Denies any fever. Resumed her normal breakfast and will be by for x-ray today.

## 2023-04-21 NOTE — Telephone Encounter (Signed)
-----   Message from Shellia Cleverly sent at 04/21/2023  7:40 AM EDT ----- Can you please call to check in on this patient this morning.  She had procedure yesterday and shortly after completion of the procedure she had a cough and required suction.  Was discharged home without issue, but called into the office in the afternoon stating she had a low-grade fever and continued cough.  I ordered Augmentin and CXR.  Does not look like she ever went for the CXR.  Can you please call to see how she is feeling this morning.  Any fever overnight?  Still having a cough?  Thanks.

## 2023-04-22 LAB — SURGICAL PATHOLOGY

## 2023-04-23 ENCOUNTER — Encounter: Payer: Self-pay | Admitting: Gastroenterology

## 2023-04-24 ENCOUNTER — Encounter: Payer: Self-pay | Admitting: Gastroenterology

## 2023-09-17 ENCOUNTER — Other Ambulatory Visit: Payer: Self-pay | Admitting: Physician Assistant

## 2023-09-17 DIAGNOSIS — Z1231 Encounter for screening mammogram for malignant neoplasm of breast: Secondary | ICD-10-CM

## 2023-09-30 ENCOUNTER — Other Ambulatory Visit: Payer: Self-pay | Admitting: Medical Genetics

## 2023-10-05 ENCOUNTER — Ambulatory Visit
Admission: RE | Admit: 2023-10-05 | Discharge: 2023-10-05 | Disposition: A | Discharge status: Home / Self Care | Source: Ambulatory Visit | Attending: Physician Assistant | Admitting: Physician Assistant

## 2023-10-05 DIAGNOSIS — Z1231 Encounter for screening mammogram for malignant neoplasm of breast: Secondary | ICD-10-CM

## 2023-11-26 ENCOUNTER — Other Ambulatory Visit (HOSPITAL_COMMUNITY): Payer: Self-pay

## 2023-12-09 ENCOUNTER — Other Ambulatory Visit: Payer: Self-pay | Admitting: Medical Genetics

## 2023-12-09 DIAGNOSIS — Z006 Encounter for examination for normal comparison and control in clinical research program: Secondary | ICD-10-CM
